# Patient Record
Sex: Female | Born: 1985 | Race: White | Hispanic: No | Marital: Single | Smoking: Current every day smoker
Health system: Southern US, Community
[De-identification: ages and names within clinical notes are randomized; demographics above are authoritative.]

## PROBLEM LIST (undated history)

## (undated) DIAGNOSIS — F32A Depression, unspecified: Secondary | ICD-10-CM

## (undated) DIAGNOSIS — F329 Major depressive disorder, single episode, unspecified: Secondary | ICD-10-CM

## (undated) DIAGNOSIS — B1920 Unspecified viral hepatitis C without hepatic coma: Secondary | ICD-10-CM

## (undated) DIAGNOSIS — F1910 Other psychoactive substance abuse, uncomplicated: Secondary | ICD-10-CM

## (undated) HISTORY — DX: Unspecified viral hepatitis C without hepatic coma: B19.20

---

## 2006-06-11 ENCOUNTER — Inpatient Hospital Stay: Payer: Self-pay | Admitting: Unknown Physician Specialty

## 2006-10-30 ENCOUNTER — Emergency Department: Payer: Self-pay | Admitting: Emergency Medicine

## 2007-02-11 ENCOUNTER — Emergency Department: Payer: Self-pay | Admitting: Emergency Medicine

## 2010-12-28 ENCOUNTER — Emergency Department: Payer: Self-pay | Admitting: Internal Medicine

## 2011-08-28 ENCOUNTER — Emergency Department: Payer: Self-pay | Admitting: Unknown Physician Specialty

## 2011-09-26 ENCOUNTER — Observation Stay: Payer: Self-pay

## 2011-10-08 ENCOUNTER — Inpatient Hospital Stay: Payer: Self-pay

## 2011-10-08 LAB — CBC WITH DIFFERENTIAL/PLATELET
Basophil #: 0 10*3/uL (ref 0.0–0.1)
Basophil %: 0.1 %
Eosinophil #: 0.1 10*3/uL (ref 0.0–0.7)
HCT: 34.1 % — ABNORMAL LOW (ref 35.0–47.0)
HGB: 11.4 g/dL — ABNORMAL LOW (ref 12.0–16.0)
Lymphocyte %: 24.6 %
MCH: 30.1 pg (ref 26.0–34.0)
MCHC: 33.6 g/dL (ref 32.0–36.0)
Monocyte #: 0.4 10*3/uL (ref 0.0–0.7)
Neutrophil %: 70 %
Platelet: 223 10*3/uL (ref 150–440)
RDW: 14 % (ref 11.5–14.5)
WBC: 9.6 10*3/uL (ref 3.6–11.0)

## 2011-10-10 LAB — HEMATOCRIT: HCT: 32.6 % — ABNORMAL LOW (ref 35.0–47.0)

## 2011-12-17 ENCOUNTER — Emergency Department: Payer: Self-pay | Admitting: Emergency Medicine

## 2011-12-17 LAB — COMPREHENSIVE METABOLIC PANEL
Alkaline Phosphatase: 68 U/L (ref 50–136)
BUN: 14 mg/dL (ref 7–18)
Bilirubin,Total: 0.3 mg/dL (ref 0.2–1.0)
Calcium, Total: 8.9 mg/dL (ref 8.5–10.1)
EGFR (Non-African Amer.): >60
Glucose: 85 mg/dL (ref 65–99)
SGOT(AST): 32 U/L (ref 15–37)
SGPT (ALT): 46 U/L
Total Protein: 8 g/dL (ref 6.4–8.2)

## 2011-12-17 LAB — TSH: Thyroid Stimulating Horm: 0.553 u[IU]/mL

## 2011-12-17 LAB — URINALYSIS, COMPLETE
Blood: NEGATIVE
Glucose,UR: NEGATIVE mg/dL (ref 0–75)
Nitrite: NEGATIVE
Specific Gravity: 1.021 (ref 1.003–1.030)
WBC UR: NONE SEEN /HPF (ref 0–5)

## 2011-12-17 LAB — DRUG SCREEN, URINE
Amphetamines, Ur Screen: NEGATIVE (ref ?–1000)
Barbiturates, Ur Screen: NEGATIVE (ref ?–200)
MDMA (Ecstasy)Ur Screen: NEGATIVE (ref ?–500)
Methadone, Ur Screen: NEGATIVE (ref ?–300)
Opiate, Ur Screen: POSITIVE (ref ?–300)
Phencyclidine (PCP) Ur S: NEGATIVE (ref ?–25)
Tricyclic, Ur Screen: NEGATIVE (ref ?–1000)

## 2011-12-17 LAB — CBC
MCH: 30.3 pg (ref 26.0–34.0)
Platelet: 309 10*3/uL (ref 150–440)
RBC: 4.71 10*6/uL (ref 3.80–5.20)
WBC: 8.7 10*3/uL (ref 3.6–11.0)

## 2011-12-17 LAB — ETHANOL
Ethanol %: <0.003 % (ref 0.000–0.080)
Ethanol: <3 mg/dL

## 2011-12-17 LAB — SALICYLATE LEVEL: Salicylates, Serum: 2.3 mg/dL

## 2012-08-13 ENCOUNTER — Emergency Department: Payer: Self-pay | Admitting: Emergency Medicine

## 2012-08-15 LAB — BETA STREP CULTURE(ARMC)

## 2012-11-15 ENCOUNTER — Emergency Department: Payer: Self-pay | Admitting: Emergency Medicine

## 2014-04-12 ENCOUNTER — Observation Stay: Payer: Self-pay | Admitting: Obstetrics and Gynecology

## 2014-04-12 LAB — URINALYSIS, COMPLETE
Bilirubin,UR: NEGATIVE
Blood: NEGATIVE
GLUCOSE, UR: NEGATIVE mg/dL (ref 0–75)
Nitrite: NEGATIVE
Ph: 6 (ref 4.5–8.0)
Protein: NEGATIVE
RBC,UR: 3 /HPF (ref 0–5)
Specific Gravity: 1.013 (ref 1.003–1.030)
WBC UR: 37 /HPF (ref 0–5)

## 2014-04-12 LAB — DRUG SCREEN, URINE
AMPHETAMINES, UR SCREEN: NEGATIVE (ref ?–1000)
Barbiturates, Ur Screen: NEGATIVE (ref ?–200)
Benzodiazepine, Ur Scrn: NEGATIVE (ref ?–200)
Cannabinoid 50 Ng, Ur ~~LOC~~: POSITIVE (ref ?–50)
Cocaine Metabolite,Ur ~~LOC~~: POSITIVE (ref ?–300)
MDMA (Ecstasy)Ur Screen: NEGATIVE (ref ?–500)
Methadone, Ur Screen: NEGATIVE (ref ?–300)
Opiate, Ur Screen: NEGATIVE (ref ?–300)
Phencyclidine (PCP) Ur S: NEGATIVE (ref ?–25)
TRICYCLIC, UR SCREEN: NEGATIVE (ref ?–1000)

## 2014-05-13 ENCOUNTER — Ambulatory Visit: Payer: Self-pay | Admitting: Obstetrics and Gynecology

## 2014-05-13 LAB — CBC WITH DIFFERENTIAL/PLATELET
BASOS ABS: 0 10*3/uL (ref 0.0–0.1)
BASOS PCT: 0.4 %
EOS PCT: 0.9 %
Eosinophil #: 0.1 10*3/uL (ref 0.0–0.7)
HCT: 37.7 % (ref 35.0–47.0)
HGB: 12 g/dL (ref 12.0–16.0)
Lymphocyte #: 1.9 10*3/uL (ref 1.0–3.6)
Lymphocyte %: 23.1 %
MCH: 28.8 pg (ref 26.0–34.0)
MCHC: 31.9 g/dL — ABNORMAL LOW (ref 32.0–36.0)
MCV: 90 fL (ref 80–100)
Monocyte #: 0.4 x10 3/mm (ref 0.2–0.9)
Monocyte %: 4.3 %
NEUTROS PCT: 71.3 %
Neutrophil #: 5.8 10*3/uL (ref 1.4–6.5)
PLATELETS: 183 10*3/uL (ref 150–440)
RBC: 4.17 10*6/uL (ref 3.80–5.20)
RDW: 14.4 % (ref 11.5–14.5)
WBC: 8.2 10*3/uL (ref 3.6–11.0)

## 2014-05-14 ENCOUNTER — Inpatient Hospital Stay: Payer: Self-pay

## 2014-05-14 LAB — DRUG SCREEN, URINE
Amphetamines, Ur Screen: NEGATIVE (ref ?–1000)
Barbiturates, Ur Screen: NEGATIVE (ref ?–200)
Benzodiazepine, Ur Scrn: NEGATIVE (ref ?–200)
Cannabinoid 50 Ng, Ur ~~LOC~~: POSITIVE (ref ?–50)
Cocaine Metabolite,Ur ~~LOC~~: NEGATIVE (ref ?–300)
MDMA (ECSTASY) UR SCREEN: NEGATIVE (ref ?–500)
Methadone, Ur Screen: NEGATIVE (ref ?–300)
OPIATE, UR SCREEN: NEGATIVE (ref ?–300)
Phencyclidine (PCP) Ur S: NEGATIVE (ref ?–25)
Tricyclic, Ur Screen: NEGATIVE (ref ?–1000)

## 2014-05-15 DIAGNOSIS — I495 Sick sinus syndrome: Secondary | ICD-10-CM

## 2014-05-15 LAB — COMPREHENSIVE METABOLIC PANEL
ALK PHOS: 133 U/L — AB
ALT: 11 U/L — AB
ANION GAP: 6 — AB (ref 7–16)
AST: 28 U/L (ref 15–37)
Albumin: 2.1 g/dL — ABNORMAL LOW (ref 3.4–5.0)
BILIRUBIN TOTAL: 0.3 mg/dL (ref 0.2–1.0)
BUN: 5 mg/dL — ABNORMAL LOW (ref 7–18)
CALCIUM: 7.6 mg/dL — AB (ref 8.5–10.1)
CREATININE: 0.78 mg/dL (ref 0.60–1.30)
Chloride: 102 mmol/L (ref 98–107)
Co2: 29 mmol/L (ref 21–32)
EGFR (African American): >60
EGFR (Non-African Amer.): >60
GLUCOSE: 64 mg/dL — AB (ref 65–99)
Osmolality: 269 (ref 275–301)
Potassium: 3.3 mmol/L — ABNORMAL LOW (ref 3.5–5.1)
Sodium: 137 mmol/L (ref 136–145)
Total Protein: 4.8 g/dL — ABNORMAL LOW (ref 6.4–8.2)

## 2014-05-15 LAB — HEMATOCRIT: HCT: 35.3 % (ref 35.0–47.0)

## 2014-05-16 LAB — PATHOLOGY REPORT

## 2014-10-23 ENCOUNTER — Emergency Department: Payer: Self-pay | Admitting: Emergency Medicine

## 2014-12-14 NOTE — Consult Note (Signed)
General Aspect Primary OB/GYN: Dr. Glennon Mac Primary Cardiologist: New to [facility] _____________________________  29 y/o female G2P1001 with no previously known cardiac history who presented to [facility] on 05/14/14 for C/S and was found to be bradycardic on routine post op EKG with electronic reading stating "possible anterior infarct." We are consulted for further evaluation.  ____________________________  PMH: 1. Substance abuse (Cocaine & THC) - UDS positive for THC only this admission 2. Asthma 3. H/o pyelonephritis 4. Tobacco abuse ____________________________  Surgical Hx: 1. Prior C/S   Present Illness 29 y/o female G2P1001 with no previously known cardiac history who presented to [facility] on 05/14/14 for C/S and was found to be bradycardic on routine post op EKG with electronic reading stating "possible anterior infarct."  We are consulted for further evaluation.  Patient presented to [facility] 9/22 for C/S and planned bilateral tubal ligation which she tolerated well. Upon routine post op check she had an EKG performed this morning which was indicated a sinus bradycardia of 47 BPM, without st/t changes. The electronic reading read "possible anterior infarct." She does have a history of polysubstance abuse including tobacco abuse, THC, and cocaine. UDS during this admission is positive for THC only.   She denies any SOB, DOE, presyncope, palpitations or h/o chest pain. She is sore from her operation and does have some right shoulder discomfort. She also notes some wheezing. She takes albuterol at home   Physical Exam:  GEN well developed, well nourished, no acute distress   HEENT PERRL   RESP normal resp effort  wheezing  bilateral wheezing, mild   CARD Regular rate and rhythm  No murmur   ABD denies tenderness  soft  normal BS   EXTR positive edema   SKIN normal to palpation   NEURO cranial nerves intact   PSYCH alert, A+O to time, place, person, good insight   Review of Systems:   Subjective/Chief Complaint ABD pain extending into right shoulder   General: No Complaints   Skin: No Complaints   ENT: No Complaints   Eyes: No Complaints   Neck: No Complaints   Respiratory: No Complaints   Cardiovascular: No Complaints   Gastrointestinal: abdominal   Genitourinary: No Complaints    Vascular: No Complaints   Musculoskeletal: No Complaints  right shoulder   Neurologic: No Complaints   Hematologic: No Complaints   Endocrine: No Complaints   Psychiatric: No Complaints   Review of Systems: All other systems were reviewed and found to be negative   Medications/Allergies Reviewed Medications/Allergies reviewed   Family & Social History:  Family and Social History:  Family History Negative  Zap   Social History positive Illicit drugs, UDS positive for THC. H/o cocaine use   + Tobacco Current (within 1 year)   Place of Living Home       Asthma:    Asthma:    Plantars Warts:    Herpes - genital:    denies:    Previous C/S:          Admit Reason:   Cesarean deliv (669.71): Onset Date: 13-May-2014, Status: Active, Coding System: ICD9, Coded Name: Cesarean delivery, without mention of indication, delivered, with or without mention of antepartum condition  Home Medications: Medication Instructions Status  albuterol CFC free 90 mcg/inh inhalation aerosol 2 puff(s) inhaled 4 times a day, As Needed - for Wheezing Active   Lab Results:  Hepatic:  23-Sep-15 04:34   Bilirubin, Total 0.3  Alkaline Phosphatase  133 (46-116 NOTE: New Reference  Range 03/12/14)  SGPT (ALT)  11 (14-63 NOTE: New Reference Range 03/12/14)  SGOT (AST) 28  Total Protein, Serum  4.8  Albumin, Serum  2.1  Routine Chem:  23-Sep-15 04:34   Glucose, Serum  64  BUN  5  Creatinine (comp) 0.78  Sodium, Serum 137  Potassium, Serum  3.3  Chloride, Serum 102  CO2, Serum 29  Calcium (Total), Serum  7.6  Osmolality (calc) 269  eGFR (African American) >60  eGFR  (Non-African American) >60 (eGFR values <22m/min/1.73 m2 may be an indication of chronic kidney disease (CKD). Calculated eGFR is useful in patients with stable renal function. The eGFR calculation will not be reliable in acutely ill patients when serum creatinine is changing rapidly. It is not useful in  patients on dialysis. The eGFR calculation may not be applicable to patients at the low and high extremes of body sizes, pregnant women, and vegetarians.)  Anion Gap  6  Urine Drugs:  [id number]-[id number]  Tricyclic Antidepressant, Ur Qual (comp) NEGATIVE (Result(s) reported on 14 May 2014 at 09:24AM.)  Amphetamines, Urine Qual. NEGATIVE  MDMA, Urine Qual. NEGATIVE  Cocaine Metabolite, Urine Qual. NEGATIVE  Opiate, Urine qual NEGATIVE  Phencyclidine, Urine Qual. NEGATIVE  Cannabinoid, Urine Qual. POSITIVE  Barbiturates, Urine Qual. NEGATIVE  Benzodiazepine, Urine Qual. NEGATIVE (----------------- The URINE DRUG SCREEN provides only a preliminary, unconfirmed analytical test result and should not be used for non-medical  purposes.  Clinical consideration and professional judgment should be  applied to any positive drug screen result due to possible interfering substances.  A more specific alternate chemical method must be used in order to obtain a confirmed analytical result.  Gas chromatography/mass spectrometry (GC/MS) is the preferred confirmatory method.)  Methadone, Urine Qual. NEGATIVE  Routine Hem:  23-Sep-15 04:34   Hematocrit (CBC) 35.3 (Result(s) reported on 15 May 2014 at 05:11AM.)   EKG:  EKG Interp. by me   Interpretation EKG shows sinus bradycardia, 47, no acute st/t changes, poor R wave progression through the anterior precordial leads    No Known Allergies:   Vital Signs/Nurse's Notes: **Vital Signs.:   23-Sep-15 04:30  Vital Signs Type Routine  Temperature Temperature (F) 97.8  Celsius 36.5  Temperature Source oral  Pulse Pulse 52  Respirations  Respirations 18  Systolic BP Systolic BP 1494 Diastolic BP (mmHg) Diastolic BP (mmHg) 82  Mean BP 99  Pulse Ox % Pulse Ox % 98  Pulse Ox Activity Level  At rest  Oxygen Delivery Room Air/ 21 %    Impression 29y/o female G2P1001 with no previously known cardiac history who [facility] and was found to be bradycardic on EKG with electronic reading stating "possible anterior infarct." We are consulted for further evaluation.  1. Sinus bradycardia: -Patient is asymptomatic. As an outpt with no significant near syncope or syncope.  -Vital signs indicate her HR was bradycardic prior to administration of any anesthesia with a HR of 47 upon admission, indicating it is not related to medication and was occuring prior to admission - with a stable BP. -Up and walking she is in the 60s Given this she does not require any further evaluation as an outpatient or treatment to incude Holter or PPM.  -Should she develop symptoms would recommend outpatient follow up for cardiac monitor. Our office info was provided. She will monitor her heart rate periodically using her phone pulse rate apps   2. EKG: essentially normal, finding read by computer  is likely from lead placement. -Given her lack of ischemic symptoms no further work up required.  -Recommend no THC, tobacco, or cocaine.  3. Wheezing: -Respiratory therapy -Albuterol has been added by Dr. Glennon Mac as she is not breast feeding incentive spirometer ordered  4. Right sided chest wall pain/abdominal pain: -2/2 surgery -Reproducible   Electronic Signatures: Rise Mu (PA-C)  (Signed 23-Sep-15 10:30)  Authored: General Aspect/Present Illness, History and Physical Exam, Review of System, Family & Social History, Past Medical History, Home Medications, Labs, EKG , Allergies, Vital Signs/Nurse's Notes, Impression/Plan Ida Rogue (MD)  (Signed 23-Sep-15 14:03)  Authored: General Aspect/Present Illness, History and  Physical Exam, Review of System, Family & Social History, Past Medical History, Health Issues, Labs, EKG , Impression/Plan  Co-Signer: General Aspect/Present Illness, History and Physical Exam, Review of System, Family & Social History, Past Medical History, Home Medications, Labs, EKG , Allergies, Vital Signs/Nurse's Notes, Impression/Plan   Last Updated: 23-Sep-15 14:03 by Ida Rogue (MD)

## 2014-12-14 NOTE — Op Note (Signed)
PATIENT NAME:  Kathleen Tate, Kathleen Tate MR#:  [id number] DATE OF BIRTH:  February 24, 1986  DATE OF PROCEDURE:  05/14/2014  PREOPERATIVE DIAGNOSES:  1.  Intrauterine pregnancy at 9039 weeks gestational age.  2.  History of prior cesarean section, desires repeat.  3.  Desires permanent sterility.  4.  History of drug abuse in pregnancy.   POSTOPERATIVE DIAGNOSES:   1.  Intrauterine pregnancy at 2339 weeks gestational age.  2.  History of prior cesarean section, desires repeat.  3.  Desires permanent sterility.  4.  History of drug abuse in pregnancy.   PROCEDURES:  1.  Repeat low transverse cesarean section via Pfannenstiel incision.  2.  Bilateral tubal ligation and via Pomeroy method.   ANESTHESIA: Spinal.   SURGEON: Thomasene MohairStephen Nasiir Monts, MD    ASSISTANT SURGEON: Annamarie MajorPaul Harris, MD    ESTIMATED BLOOD LOSS: 750 mL   OPERATIVE FLUIDS: 1200 mL crystalloid.   COMPLICATIONS: None.   FINDINGS:  1.  Viable female infant with Apgars of 9 at one minute and 9 at five minutes with weight of 2570 grams.  2.  Normal appearing gravid uterus, fallopian tubes and ovaries.   SPECIMENS: Portion of right and left fallopian tubes.   CONDITION AT END OF PROCEDURE: Stable.   PROCEDURE IN DETAIL: The patient was taken to the Operating Room where regional, spinal anesthesia was administered and found to be added adequate.  The patient was placed in the dorsal supine position with a leftward tilt and prepped and draped in the usual sterile fashion. After a timeout was called, a Pfannenstiel incision was made with a scalpel and carried through the various layers until the peritoneum was identified and entered sharply.  The peritoneal opening was extended and a bladder flap was created.  A bladder retractor was placed to pull the bladder out of the operative area of interest.  A low-transverse hysterotomy was made with the scalpel and extended laterally with cranial and caudal tension.  Rupture of membranes occurred with extension of  the hysterotomy for return of clear fluid.  The fetal vertex was grasped and elevated to the hysterotomy.  Vertex was delivered with fundal pressure, followed by the shoulders and the rest of the body without difficulty.  The cord was clamped and cut and the infant was handed to the pediatrician.  The placenta delivered spontaneously intact with 3 vessel cord.  Of note, there was a single nuchal cord, which was reduced at the time of delivery. The uterus was exteriorized and cleared of all clots and debris.  The hysterotomy was closed using #0 Vicryl in a running locked fashion.  Hemostasis was obtained with a single layer and so a single layer closure was performed given that she was having a tubal ligation.   Attention was turned to the left fallopian tube where the fallopian tube was identified and grasped with a Babcock clamp approximately 4 cm from the cornua.  Using the Pomeroy technique, a loop of approximately 3 cm in length was suture ligated using #0 plain gut and excised with good hemostasis noted.  The same procedure was carried out on the right side without difficulty.   The uterus was returned to the abdomen and the abdomen was cleared of all clots and debris.  The peritoneum was reapproximated using #0 Vicryl in a running fashion.   After verification of hemostasis on the rectus muscle bellies, the On-Q catheters were placed according to the manufacturer's recommendations.  They were inserted approximately 4 cm cephalad to the  incision line, approximately 1 cm apart, straddling the midline.  They were inserted to a depth of approximately the fourth mark on the catheters and positioned just superficial to rectus abdominis muscles and just deep to the rectus fascia.    The rectus fascia was closed using #1 PDS, looped. The skin was reapproximated using stapler.   The On-Q catheters were each bolused with 0.5% Marcaine plain, 5 mL in each catheter, for a total of 10 mL.  The catheters were  affixed to the skin using Dermabond as well as Steri-Strips and Tegaderm.   The patient tolerated the procedure well.  Sponge, lap, and needle counts were correct x 2.  The patient was given Ancef 2 grams prior to skin incision for antibiotic prophylaxis.  For VTE prophylaxis, the patient was wearing pneumatic compression stockings which were on and operating throughout the entire procedure.      ____________________________ Conard Novak, MD sdj:DT D: 05/14/2014 09:53:28 ET T: 05/14/2014 10:46:24 ET JOB#: [id number]  cc: Conard Novak, MD, <Dictator> Conard Novak MD ELECTRONICALLY SIGNED 05/15/2014 7:39

## 2014-12-15 NOTE — Op Note (Signed)
PATIENT NAME:  Kathleen Tate, Kathleen Tate MR#:  [id number] DATE OF BIRTH:  1986-01-19  DATE OF PROCEDURE:  10/09/2011  PREOPERATIVE DIAGNOSIS: Intrauterine pregnancy, [redacted] weeks gestation, fetal intolerance to labor.   POSTOPERATIVE DIAGNOSIS:  Intrauterine pregnancy, [redacted] weeks gestation, fetal intolerance to labor.    PROCEDURE PERFORMED: Low transverse cesarean section.   SURGEON: Deloris Pinghilip J. Luella Cookosenow, M.D.   FIRST ASSISTANT:  Tammy Gordy   NEONATOLOGIST:  Orpah Clintone. Horowitz  PREOP SITUATION: 29 year old gravida 1 has been in active labor and had several decelerations, most recently had a prolonged deceleration. Mutual decision was made to proceed with cesarean section.   OPERATIVE FINDINGS: 6-pound, 9-ounce female infant- Tristan-delivered at 09:09.   DESCRIPTION OF PROCEDURE: After adequate conduction anesthesia, the patient was prepped and draped in routine fashion. Skin incision in modified Pfannenstiel fashion was made and carried down the various layers and the peritoneal cavity was entered. Bladder flap was created and the bladder pushed down. A low transverse incision was made and the above-described infant was delivered without difficulty. The placenta was removed manually. The uterus was then closed with continuous lock suture of chromic 1. Rectus muscle was reapproximated in the midline. On-Q pumps were then instilled. The fascia was reapproximated with continuous sutures of Maxon. The skin was closed with skin staples. Estimated blood loss was 500 mL. The patient tolerated the procedure well and left the operating room in good condition.      Sponge and needle counts were said to be correct at the end of the procedure.   ____________________________ Deloris PingPhilip J. Luella Cookosenow, MD pjr:bjt D: 10/09/2011 09:47:25 ET T: 10/09/2011 12:01:13 ET JOB#: [id number]  cc: Deloris PingPhilip J. Luella Cookosenow, MD, <Dictator> Towana BadgerPHILIP J Oreoluwa Aigner MD ELECTRONICALLY SIGNED 10/11/2011 22:03

## 2014-12-31 NOTE — H&P (Signed)
L&D Evaluation:  History:  HPI 29 yo G2P1001 at 7328w3d by D=19 wk US derived EDC of 05/21/2014 presenting with back pain.  The pain is located over the lumbar spine, bilaterally, she states she has felt subjectively febrile but not recorded temps.  Has a history of pylonephritis in her G1 pregnancy and worried this might represent a kidney infrection.  Pain is constant does not come in waves.  Also has some groind pain R>L, positional, wrose when standing.  +FM, no LOF, no VB, no ctx.  [facility] at 18 weeks, THC and cocaine positive UDS, and prior C-ection for failure to progress 10/05/13.  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   Presents with back pain   Patient's Medical History No Chronic Illness    Patient's Surgical History Previous C-Section    Medications Pre Natal Vitamins   Allergies NKDA   Social History tobacco  drugs  Cannabis use as well as cocaine positive on 12/31/13   Family History Non-Contributory   Exam:  Vital Signs stable  T98.3; BP 116/72; HR 74   Urine Protein not completed   General no apparent distress   Mental Status clear   Chest clear   Heart normal sinus rhythm   Abdomen gravid, non-tender   Estimated Fetal Weight Average for gestational age   Fetal Position vtx   Back no CVAT, reproducible paraspinal muscle tenderness over the lumbar spine   Edema no edema   Pelvic 1/long/high   Mebranes Intact   FHT 150, moderate, positive accels, no decels   Ucx irritability   Impression:  Impression 29 yo G2P1001 at 3328w3d presenting with lumbago, round ligament, possible early UTI   Plan:  Comments 1) Back pain - no CVA tenderness, afebrile, +1 leuks and bacteria on UA.  Negative blood or nitrite.  Groin pain consistent with round ligament pain.  Discussed supportive measures such as tyelnol and heat.  If develops one sides midthoracic back pain, temperatue above 100.4 to represent - urine  for culture - Rx macrobid 100mg  tab po bid x 7 days  2) Fetus - category I tracing / reactive  3) PNL A pos / ABSC neg / RI / VZI / RPR NR / HIV neg / HBsAg neg / negative 2nd trimester screen / 1-hr 74  4) Substance abuse - positive for THC on 12/24/13 and 12/31/13 & 03/18/14 Cocaine & THC - UDS today agian positive for cocaine and THC - Discussed implications of cocaine use in pregnancy, assciation with elevated blood pressures/abruption  5) History of prior LTCS for failure to progress - desires repeat with BTL (paper signed 03/04/2014) scheduled for 05/14/14 SDJ  6) Disposition - discharge home, follow up in place 04/22/2014  5) TDAP - received 03/18/14   Electronic Signatures: Lorrene ReidStaebler, Lucia Harm M (MD)  (Signed 21-Aug-15 21:51)  Authored: L&D Evaluation   Last Updated: 21-Aug-15 21:51 by Lorrene ReidStaebler, Shaelyn Decarli M (MD)

## 2014-12-31 NOTE — H&P (Signed)
L&D Evaluation:  History:   HPI 29 year old G1 p0 with EDC=10/04/2011 by LMP=12/28/2010 presented to L&84D at 1738 6/7 weeks with c/o LOF starting between 8 and 9PM tonight. Has been having contractions for awhile, but they seeem to be stronger tonight. Denies vaginal bleeding. Baby active. Shortly after arrival while lying supine,  there was a FHR decel to 80 BPM x 70 seconds, which resolved with position change. 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 LABS: A POS, RI, VI, GBS negative    Presents with leaking fluid    Patient's Medical History Asthma    Patient's Surgical History none    Medications Pre Natal Vitamins  Motrin (Ibuprofen)  Zantac and Zofran prn    Allergies NKDA    Social History tobacco  drugs  MJ use early pregnancy    Family History Non-Contributory   ROS:   ROS see HPI   Exam:   Vital Signs stable    Urine Protein not completed    General no apparent distress    Mental Status clear    Chest clear    Heart normal sinus rhythm    Abdomen gravid, non-tender    Estimated Fetal Weight 5 1/2# to 6#    Fetal Position vtx    Edema trace edema    Reflexes 1+    Pelvic no external lesions, ZOX:WRUEAVWUSSE:negative pooling, Nitrazine and ferning. cx: 1/50%/-2 per RN exam    Mebranes Intact, AFI=10.3 cm,    FHT 135 baseline with accels and one decel to 80 BPM x 70 sec, moderate variability    Ucx irregular, frequent q1 min ctxs initially, now spacing out to 3-6 min apart.    Skin dry   Impression:   Impression IUP at 38 6/7 weeks with no evidence of SROM, but had a FHR decel   Plan:   Plan EFM/NST, Advised will monitor overnight and observe for any further FHR decels.   Electronic Signatures: Trinna BalloonGutierrez, Manley Fason L (CNM)  (Signed 04-Feb-13 01:40)  Authored: L&D  Evaluation   Last Updated: 04-Feb-13 01:40 by Trinna BalloonGutierrez, Annaleese Guier L (CNM)

## 2015-04-29 ENCOUNTER — Emergency Department
Admission: EM | Admit: 2015-04-29 | Discharge: 2015-04-29 | Disposition: A | Discharge status: Home / Self Care | Payer: Medicaid Other | Attending: Emergency Medicine | Admitting: Emergency Medicine

## 2015-04-29 ENCOUNTER — Encounter: Payer: Self-pay | Admitting: *Deleted

## 2015-04-29 DIAGNOSIS — M79641 Pain in right hand: Secondary | ICD-10-CM | POA: Diagnosis present

## 2015-04-29 DIAGNOSIS — M778 Other enthesopathies, not elsewhere classified: Secondary | ICD-10-CM | POA: Insufficient documentation

## 2015-04-29 DIAGNOSIS — Z720 Tobacco use: Secondary | ICD-10-CM | POA: Diagnosis not present

## 2015-04-29 MED ORDER — MELOXICAM 7.5 MG PO TABS: 7.5000 mg | ORAL_TABLET | Freq: Every day | ORAL | Status: DC

## 2015-04-29 NOTE — ED Notes (Signed)
Pt reports pain in right hand and numbness, right hand is swollen

## 2015-04-29 NOTE — ED Notes (Signed)
velcro wrist splint applied to right wrist. Patient tolerated well and instructed on correct use.

## 2015-04-30 NOTE — ED Provider Notes (Signed)
[facility] Emergency Department Provider Note  ____________________________________________  Time seen: Approximately 5:07 PM  I have reviewed the triage vital signs and the nursing notes.   HISTORY  Chief Complaint Hand Pain   HPI Kathleen Tate is a 29 y.o. female who presents to the emergency department for evaluation of right hand pain. She states that the pain has been present off and on for several months but has become worse over the past few days. She states that it at times feels numb and tingly and shoots sharp pains up her wrist. She states that when it feels numb and tingly she has a hard time making a fist. She denies specific injury.  History reviewed. No pertinent past medical history.  There are no active problems to display for this patient.   History reviewed. No pertinent past surgical history.  Current Outpatient Rx  Name  Route  Sig  Dispense  Refill  . meloxicam (MOBIC) 7.5 MG tablet   Oral   Take 1 tablet (7.5 mg total) by mouth daily.   30 tablet   0     Allergies Review of patient's allergies indicates no known allergies.  No family history on file.  Social History Social History  Substance Use Topics  . Smoking status: Current Every Day Smoker -- 0.50 packs/day    Types: Cigarettes  . Smokeless tobacco: None  . Alcohol Use: No    Review of Systems Constitutional: No recent illness. Eyes: No visual changes. ENT: No sore throat. Cardiovascular: Denies chest pain or palpitations. Respiratory: Denies shortness of breath. Gastrointestinal: No abdominal pain.  Genitourinary: Negative for dysuria. Musculoskeletal: Pain in right hand and wrist. Skin: Negative for rash. Neurological: Negative for headaches, focal weakness or numbness. 10-point ROS otherwise negative.  ____________________________________________   PHYSICAL EXAM:  VITAL SIGNS: ED Triage Vitals  Enc Vitals Group     BP 04/29/15 1657 104/61  mmHg     Pulse Rate 04/29/15 1657 86     Resp 04/29/15 1657 18     Temp 04/29/15 1657 98.1 F (36.7 C)     Temp Source 04/29/15 1657 Oral     SpO2 04/29/15 1657 100 %     Weight 04/29/15 1657 118 lb (53.524 kg)     Height 04/29/15 1657  (1.499 m)     Head Cir --      Peak Flow --      Pain Score 04/29/15 1658 8     Pain Loc --      Pain Edu? --      Excl. in GC? --     Constitutional: Alert and oriented. Well appearing and in no acute distress. Eyes: Conjunctivae are normal. EOMI. Head: Atraumatic. Nose: No congestion/rhinnorhea. Neck: No stridor.  Respiratory: Normal respiratory effort.   Musculoskeletal: Full range of motion of fingers and wrist. No deformity. Neurologic:  Normal speech and language. No gross focal neurologic deficits are appreciated. Speech is normal. No gait instability. Skin:  Skin is warm, dry and intact. Atraumatic. Psychiatric: Mood and affect are normal. Speech and behavior are normal.  ____________________________________________   LABS (all labs ordered are listed, but only abnormal results are displayed)  Labs Reviewed - No data to display ____________________________________________  RADIOLOGY  Not indicated ____________________________________________   PROCEDURES  Procedure(s) performed: Velcro wrist splint applied by ER tech. Neurovascularly intact post-splint application.   ____________________________________________   INITIAL IMPRESSION / ASSESSMENT AND PLAN / ED COURSE  Pertinent labs & imaging  results that were available during my care of the patient were reviewed by me and considered in my medical decision making (see chart for details).  Patient was advised to follow-up with orthopedics for symptoms that aren't not improving over the week. She was advised to return the emergency department for symptoms that change or worsen if she is unable schedule  appointment. ____________________________________________   FINAL CLINICAL IMPRESSION(S) / ED DIAGNOSES  Final diagnoses:  Tendonitis of wrist, right      Chinita Pester, FNP 04/30/15 0009  Phineas Semen, MD 04/30/15 2257

## 2015-09-23 ENCOUNTER — Emergency Department
Admission: EM | Admit: 2015-09-23 | Discharge: 2015-09-23 | Disposition: A | Discharge status: Home / Self Care | Payer: Medicaid Other | Attending: Emergency Medicine | Admitting: Emergency Medicine

## 2015-09-23 ENCOUNTER — Encounter: Payer: Self-pay | Admitting: Emergency Medicine

## 2015-09-23 DIAGNOSIS — F1410 Cocaine abuse, uncomplicated: Secondary | ICD-10-CM | POA: Diagnosis not present

## 2015-09-23 DIAGNOSIS — F1910 Other psychoactive substance abuse, uncomplicated: Secondary | ICD-10-CM

## 2015-09-23 DIAGNOSIS — F1610 Hallucinogen abuse, uncomplicated: Secondary | ICD-10-CM | POA: Diagnosis not present

## 2015-09-23 DIAGNOSIS — F17210 Nicotine dependence, cigarettes, uncomplicated: Secondary | ICD-10-CM | POA: Diagnosis not present

## 2015-09-23 DIAGNOSIS — Z791 Long term (current) use of non-steroidal anti-inflammatories (NSAID): Secondary | ICD-10-CM | POA: Diagnosis not present

## 2015-09-23 HISTORY — DX: Depression, unspecified: F32.A

## 2015-09-23 HISTORY — DX: Major depressive disorder, single episode, unspecified: F32.9

## 2015-09-23 LAB — ETHANOL: Alcohol, Ethyl (B): <5 mg/dL (ref <5)

## 2015-09-23 LAB — GLUCOSE, CAPILLARY: Glucose-Capillary: 91 mg/dL (ref 65–99)

## 2015-09-23 NOTE — ED Notes (Signed)
Meal has been provided  - pt observed lying in bed NAD assessed  Continue to monitor

## 2015-09-23 NOTE — ED Notes (Signed)
BEHAVIORAL HEALTH ROUNDING Patient sleeping: Yes.   Patient alert and oriented: eyes closed  Appears asleep Behavior appropriate: Yes.  ; If no, describe:  Nutrition and fluids offered: Yes  Toileting and hygiene offered: sleeping Sitter present: q 15 minute observations and security monitoring Law enforcement present: yes  ODS 

## 2015-09-23 NOTE — [facility] Counselor (Signed)
Pt requesting inpatient SA program.  Pt states she has been to [facility] before and is requesting to go back there.  TTS counselor faxed referral information to [facility].  Pt states she would like to be discharged from ED and states she will go stay with her brother.

## 2015-09-23 NOTE — ED Notes (Signed)
Pt to ed with c/o wanting detox from drugs.  Pt states "I use everything I can"  Pt states last use of drugs was yesterday, used marijuana, suboxen, morphine and alcohol. Pt states 2 days ago used cocaine, and "molly"

## 2015-09-23 NOTE — ED Notes (Signed)
Supper provided along with an extra drink  Pt observed with no unusual behavior  Appropriate to stimulation  No verbalized needs or concerns at this time  NAD assessed  Continue to monitor 

## 2015-09-23 NOTE — Discharge Instructions (Signed)
You have been seen in the Emergency Department (ED) today for a psychiatric complaint.  You have been evaluated by psychiatry and we believe you are safe to be discharged from the hospital.   ° °Please return to the ED immediately if you have ANY thoughts of hurting yourself or anyone else, so that we may help you. ° °Please avoid alcohol and drug use. ° °Follow up with your doctor and/or therapist as soon as possible regarding today's ED visit.   Please follow up any other recommendations and clinic appointments provided by the psychiatry team that saw you in the Emergency Department. ° ° °Polysubstance Abuse °When people abuse more than one drug or type of drug it is called polysubstance or polydrug abuse. For example, many smokers also drink alcohol. This is one form of polydrug abuse. Polydrug abuse also refers to the use of a drug to counteract an unpleasant effect produced by another drug. It may also be used to help with withdrawal from another drug. People who take stimulants may become agitated. Sometimes this agitation is countered with a tranquilizer. This helps protect against the unpleasant side effects. Polydrug abuse also refers to the use of different drugs at the same time.  °Anytime drug use is interfering with normal living activities, it has become abuse. This includes problems with family and friends. Psychological dependence has developed when your mind tells you that the drug is needed. This is usually followed by physical dependence which has developed when continuing increases of drug are required to get the same feeling or "high". This is known as addiction or chemical dependency. A person's risk is much higher if there is a history of chemical dependency in the family. °SIGNS OF CHEMICAL DEPENDENCY °· You have been told by friends or family that drugs have become a problem. °· You fight when using drugs. °· You are having blackouts (not remembering what you do while using). °· You feel  sick from using drugs but continue using. °· You lie about use or amounts of drugs (chemicals) used. °· You need chemicals to get you going. °· You are suffering in work performance or in school because of drug use. °· You get sick from use of drugs but continue to use anyway. °· You need drugs to relate to people or feel comfortable in social situations. °· You use drugs to forget problems. °"Yes" answered to any of the above signs of chemical dependency indicates there are problems. The longer the use of drugs continues, the greater the problems will become. °If there is a family history of drug or alcohol use, it is best not to experiment with these drugs. Continual use leads to tolerance. After tolerance develops more of the drug is needed to get the same feeling. This is followed by addiction. With addiction, drugs become the most important part of life. It becomes more important to take drugs than participate in the other usual activities of life. This includes relating to friends and family. Addiction is followed by dependency. Dependency is a condition where drugs are now needed not just to get high, but to feel normal. °Addiction cannot be cured but it can be stopped. This often requires outside help and the care of professionals. Treatment centers are listed in the yellow pages under: Cocaine, Narcotics, and Alcoholics Anonymous. Most hospitals and clinics can refer you to a specialized care center. Talk to your caregiver if you need help. °  °This information is not intended to replace advice given   to you by your health care provider. Make sure you discuss any questions you have with your health care provider.   Document Released: 03/31/2005 Document Revised: 11/01/2011 Document Reviewed: 08/14/2014 Elsevier Interactive Patient Education [employer]

## 2015-09-23 NOTE — ED Notes (Signed)
BEHAVIORAL HEALTH ROUNDING Patient sleeping: No. Patient alert and oriented: yes Behavior appropriate: Yes.  ; If no, describe:  Nutrition and fluids offered: yes Toileting and hygiene offered: Yes  Sitter present: q15 minute observations and security  ENVIRONMENTAL ASSESSMENT Potentially harmful objects out of patient reach: Yes.   Personal belongings secured: Yes.   Patient dressed in hospital provided attire only: Yes.   Plastic bags out of patient reach: Yes.   Patient care equipment (cords, cables, call bells, lines, and drains) shortened, removed, or accounted for: Yes.   Equipment and supplies removed from bottom of stretcher: Yes.   Potentially toxic materials out of patient reach: Yes.   Sharps container removed or out of patient reach: Yes.   monitoring Law enforcement present: Yes  ODS  

## 2015-09-23 NOTE — ED Provider Notes (Addendum)
[facility] Emergency Department Provider Note  ____________________________________________  Time seen: Approximately 4:20 PM  I have reviewed the triage vital signs and the nursing notes.   HISTORY  Chief Complaint Drug Problem    HPI Kathleen Tate is a 30 y.o. female the previous history of drug abuse and depression.  The patient tells me that she was kicked out of her in-laws home today. She is been abusing drugs, and she reports she last used ecstasy and cocaine about 2 days ago. She doesn't a history of alcohol abuse but has not had a drink in several days. She denies shakes tremors or withdrawals.  The patient comes requesting detox evaluation. She denies any other concerns but does feel tired as though she is "washing out".  She denies any thoughts of wishing to harm herself or anyone else. Denies hallucinations. Denies pregnancy.  Past Medical History  Diagnosis Date  . Depression     There are no active problems to display for this patient.   History reviewed. No pertinent past surgical history.  Current Outpatient Rx  Name  Route  Sig  Dispense  Refill  . meloxicam (MOBIC) 7.5 MG tablet   Oral   Take 1 tablet (7.5 mg total) by mouth daily.   30 tablet   0     Allergies Review of patient's allergies indicates no known allergies.  History reviewed. No pertinent family history.  Social History Social History  Substance Use Topics  . Smoking status: Current Every Day Smoker -- 0.50 packs/day    Types: Cigarettes  . Smokeless tobacco: None  . Alcohol Use: Yes    Review of Systems Constitutional: No fever/chills Eyes: No visual changes. ENT: No sore throat. Cardiovascular: Denies chest pain. Respiratory: Denies shortness of breath. Gastrointestinal: No abdominal pain.  No nausea, no vomiting.  No diarrhea.  No constipation. Genitourinary: Negative for dysuria. Musculoskeletal: Negative for back pain. Skin: Negative for  rash. Neurological: Negative for headaches, focal weakness or numbness.  10-point ROS otherwise negative.  ____________________________________________   PHYSICAL EXAM:  VITAL SIGNS: ED Triage Vitals  Enc Vitals Group     BP 09/23/15 1425 122/78 mmHg     Pulse Rate 09/23/15 1425 99     Resp 09/23/15 1425 20     Temp --      Temp Source 09/23/15 1425 Oral     SpO2 09/23/15 1425 99 %     Weight 09/23/15 1425 118 lb (53.524 kg)     Height --      Head Cir --      Peak Flow --      Pain Score 09/23/15 1426 0     Pain Loc --      Pain Edu? --      Excl. in GC? --    Constitutional: Resting comfortably in a hallway, alerts to voice and is then alert and oriented. Well appearing and in no acute distress. Eyes: Conjunctivae are normal. PERRL. EOMI. Head: Atraumatic. Nose: No congestion/rhinnorhea. Mouth/Throat: Mucous membranes are moist.  Oropharynx non-erythematous. Neck: No stridor.   Cardiovascular: Normal rate, regular rhythm. Grossly normal heart sounds.  Good peripheral circulation. Respiratory: Normal respiratory effort.  No retractions. Lungs CTAB. Gastrointestinal: Soft and nontender. No distention. No abdominal bruits. No CVA tenderness. Musculoskeletal: No lower extremity tenderness nor edema.  No joint effusions. Neurologic:  Normal speech and language. No gross focal neurologic deficits are appreciated. Skin:  Skin is warm, dry and intact. No rash noted.  Psychiatric: Mood and affect are normal. Speech and behavior are normal.  ____________________________________________   LABS (all labs ordered are listed, but only abnormal results are displayed)  Labs Reviewed  ETHANOL  GLUCOSE, CAPILLARY  CBG MONITORING, ED   ____________________________________________  EKG   ____________________________________________  RADIOLOGY   ____________________________________________   PROCEDURES  Procedure(s) performed: None  Critical Care performed:  No  ____________________________________________   INITIAL IMPRESSION / ASSESSMENT AND PLAN / ED COURSE  Pertinent labs & imaging results that were available during my care of the patient were reviewed by me and considered in my medical decision making (see chart for details).  Patient presents for evaluation of substance abuse. Denies acute psychiatric concerns. History of polysubstance abuse. Have ordered consultation by TTS service to further evaluate and make recommendations for detox care. Presently does not have any indications to suggest the need for psychiatric placement. She is tired, but alerts and conversant. Well oriented in no distress. Denies any overdose or suicidal ideation. No evidence of acute withdrawal.  ----------------------------------------- 4:24 PM on 09/23/2015 ----------------------------------------- Patient awake and alert. No distress. Labs reviewed. Plan to transfer to [facility] pending detox evaluation by TTS team. Anticipate disposition based on TTS recommendations.  ____________________________________________   FINAL CLINICAL IMPRESSION(S) / ED DIAGNOSES  Final diagnoses:  Polysubstance abuse      Sharyn Creamer, MD 09/23/15 1908  ----------------------------------------- 8:12 PM on 09/23/2015 -----------------------------------------  Patient awake alert ambulatory. She is having her brother picked her up and will be going to [facility].  Return precautions and treatment recommendations and follow-up discussed with the patient who is agreeable with the plan.   Sharyn Creamer, MD 09/23/15 2012

## 2015-09-23 NOTE — ED Notes (Signed)
BEHAVIORAL HEALTH ROUNDING Patient sleeping: No. Patient alert and oriented: yes Behavior appropriate: Yes.  ; If no, describe:  Nutrition and fluids offered: yes Toileting and hygiene offered: Yes  Sitter present: q15 minute observations and security  monitoring Law enforcement present: Yes  ODS  

## 2016-03-19 ENCOUNTER — Encounter: Payer: Self-pay | Admitting: Emergency Medicine

## 2016-03-19 ENCOUNTER — Emergency Department: Payer: Medicaid Other

## 2016-03-19 ENCOUNTER — Inpatient Hospital Stay
Admission: EM | Admit: 2016-03-19 | Discharge: 2016-03-20 | DRG: 442 | Discharge status: Against Medical Advice | Payer: Medicaid Other | Attending: Internal Medicine | Admitting: Internal Medicine

## 2016-03-19 DIAGNOSIS — D72819 Decreased white blood cell count, unspecified: Secondary | ICD-10-CM

## 2016-03-19 DIAGNOSIS — I959 Hypotension, unspecified: Secondary | ICD-10-CM | POA: Diagnosis present

## 2016-03-19 DIAGNOSIS — N289 Disorder of kidney and ureter, unspecified: Secondary | ICD-10-CM | POA: Diagnosis present

## 2016-03-19 DIAGNOSIS — L03113 Cellulitis of right upper limb: Secondary | ICD-10-CM | POA: Diagnosis present

## 2016-03-19 DIAGNOSIS — F329 Major depressive disorder, single episode, unspecified: Secondary | ICD-10-CM | POA: Diagnosis present

## 2016-03-19 DIAGNOSIS — K759 Inflammatory liver disease, unspecified: Secondary | ICD-10-CM

## 2016-03-19 DIAGNOSIS — F1490 Cocaine use, unspecified, uncomplicated: Secondary | ICD-10-CM | POA: Diagnosis present

## 2016-03-19 DIAGNOSIS — B179 Acute viral hepatitis, unspecified: Secondary | ICD-10-CM | POA: Diagnosis not present

## 2016-03-19 DIAGNOSIS — F1010 Alcohol abuse, uncomplicated: Secondary | ICD-10-CM | POA: Diagnosis present

## 2016-03-19 DIAGNOSIS — D696 Thrombocytopenia, unspecified: Secondary | ICD-10-CM | POA: Diagnosis present

## 2016-03-19 DIAGNOSIS — R778 Other specified abnormalities of plasma proteins: Secondary | ICD-10-CM | POA: Diagnosis present

## 2016-03-19 DIAGNOSIS — F17210 Nicotine dependence, cigarettes, uncomplicated: Secondary | ICD-10-CM | POA: Diagnosis present

## 2016-03-19 DIAGNOSIS — E876 Hypokalemia: Secondary | ICD-10-CM | POA: Diagnosis present

## 2016-03-19 DIAGNOSIS — E860 Dehydration: Secondary | ICD-10-CM | POA: Diagnosis present

## 2016-03-19 DIAGNOSIS — L03114 Cellulitis of left upper limb: Secondary | ICD-10-CM | POA: Diagnosis present

## 2016-03-19 DIAGNOSIS — R740 Nonspecific elevation of levels of transaminase and lactic acid dehydrogenase [LDH]: Secondary | ICD-10-CM | POA: Diagnosis present

## 2016-03-19 DIAGNOSIS — R000 Tachycardia, unspecified: Secondary | ICD-10-CM | POA: Diagnosis present

## 2016-03-19 DIAGNOSIS — F1590 Other stimulant use, unspecified, uncomplicated: Secondary | ICD-10-CM | POA: Diagnosis present

## 2016-03-19 DIAGNOSIS — R945 Abnormal results of liver function studies: Secondary | ICD-10-CM

## 2016-03-19 DIAGNOSIS — F1290 Cannabis use, unspecified, uncomplicated: Secondary | ICD-10-CM | POA: Diagnosis present

## 2016-03-19 DIAGNOSIS — R1011 Right upper quadrant pain: Secondary | ICD-10-CM

## 2016-03-19 DIAGNOSIS — R7989 Other specified abnormal findings of blood chemistry: Secondary | ICD-10-CM

## 2016-03-19 DIAGNOSIS — Z590 Homelessness: Secondary | ICD-10-CM | POA: Diagnosis not present

## 2016-03-19 DIAGNOSIS — E871 Hypo-osmolality and hyponatremia: Secondary | ICD-10-CM | POA: Diagnosis present

## 2016-03-19 HISTORY — DX: Other psychoactive substance abuse, uncomplicated: F19.10

## 2016-03-19 LAB — CBC WITH DIFFERENTIAL/PLATELET
BASOS ABS: 0 10*3/uL (ref 0–0.1)
BASOS PCT: 0 %
EOS ABS: 0 10*3/uL (ref 0–0.7)
EOS PCT: 0 %
HCT: 45 % (ref 35.0–47.0)
Hemoglobin: 15.6 g/dL (ref 12.0–16.0)
Lymphocytes Relative: 4 %
Lymphs Abs: 0.2 10*3/uL — ABNORMAL LOW (ref 1.0–3.6)
MCH: 31.3 pg (ref 26.0–34.0)
MCHC: 34.7 g/dL (ref 32.0–36.0)
MCV: 90.3 fL (ref 80.0–100.0)
MONO ABS: 0.1 10*3/uL — AB (ref 0.2–0.9)
MONOS PCT: 3 %
NEUTROS ABS: 4.1 10*3/uL (ref 1.4–6.5)
Neutrophils Relative %: 93 %
PLATELETS: 139 10*3/uL — AB (ref 150–440)
RBC: 4.98 MIL/uL (ref 3.80–5.20)
RDW: 13.3 % (ref 11.5–14.5)
WBC: 4.5 10*3/uL (ref 3.6–11.0)

## 2016-03-19 LAB — ACETAMINOPHEN LEVEL

## 2016-03-19 LAB — COMPREHENSIVE METABOLIC PANEL
ALT: 405 U/L — AB (ref 14–54)
AST: 739 U/L — ABNORMAL HIGH (ref 15–41)
Albumin: 4.2 g/dL (ref 3.5–5.0)
Alkaline Phosphatase: 114 U/L (ref 38–126)
Anion gap: 9 (ref 5–15)
BILIRUBIN TOTAL: 3.7 mg/dL — AB (ref 0.3–1.2)
BUN: 17 mg/dL (ref 6–20)
CALCIUM: 8.7 mg/dL — AB (ref 8.9–10.3)
CHLORIDE: 102 mmol/L (ref 101–111)
CO2: 25 mmol/L (ref 22–32)
CREATININE: 1.03 mg/dL — AB (ref 0.44–1.00)
Glucose, Bld: 112 mg/dL — ABNORMAL HIGH (ref 65–99)
Potassium: 3 mmol/L — ABNORMAL LOW (ref 3.5–5.1)
Sodium: 136 mmol/L (ref 135–145)
TOTAL PROTEIN: 7.7 g/dL (ref 6.5–8.1)

## 2016-03-19 LAB — URINALYSIS COMPLETE WITH MICROSCOPIC (ARMC ONLY)
GLUCOSE, UA: NEGATIVE mg/dL
HGB URINE DIPSTICK: NEGATIVE
Ketones, ur: NEGATIVE mg/dL
LEUKOCYTES UA: NEGATIVE
NITRITE: NEGATIVE
Protein, ur: >500 mg/dL — AB
SPECIFIC GRAVITY, URINE: 1.031 — AB (ref 1.005–1.030)
pH: 6 (ref 5.0–8.0)

## 2016-03-19 LAB — URINE DRUG SCREEN, QUALITATIVE (ARMC ONLY)
AMPHETAMINES, UR SCREEN: POSITIVE — AB
BENZODIAZEPINE, UR SCRN: POSITIVE — AB
Barbiturates, Ur Screen: NOT DETECTED
Cannabinoid 50 Ng, Ur ~~LOC~~: POSITIVE — AB
Cocaine Metabolite,Ur ~~LOC~~: POSITIVE — AB
MDMA (ECSTASY) UR SCREEN: NOT DETECTED
METHADONE SCREEN, URINE: NOT DETECTED
Opiate, Ur Screen: NOT DETECTED
PHENCYCLIDINE (PCP) UR S: NOT DETECTED
TRICYCLIC, UR SCREEN: NOT DETECTED

## 2016-03-19 LAB — PREGNANCY, URINE: PREG TEST UR: NEGATIVE

## 2016-03-19 LAB — SALICYLATE LEVEL

## 2016-03-19 LAB — ETHANOL

## 2016-03-19 LAB — LIPASE, BLOOD: LIPASE: 19 U/L (ref 11–51)

## 2016-03-19 LAB — LACTIC ACID, PLASMA
LACTIC ACID, VENOUS: 1 mmol/L (ref 0.5–1.9)
LACTIC ACID, VENOUS: 1.1 mmol/L (ref 0.5–1.9)

## 2016-03-19 MED ORDER — IBUPROFEN 400 MG PO TABS
400.0000 mg | ORAL_TABLET | Freq: Three times a day (TID) | ORAL | Status: DC | PRN
  Administered 2016-03-19 – 2016-03-20 (×2): 400 mg via ORAL
  Filled 2016-03-19 (×2): qty 1

## 2016-03-19 MED ORDER — POTASSIUM CHLORIDE IN NACL 20-0.9 MEQ/L-% IV SOLN
INTRAVENOUS | Status: DC
  Administered 2016-03-19: 18:00:00 via INTRAVENOUS
  Filled 2016-03-19 (×3): qty 1000

## 2016-03-19 MED ORDER — ONDANSETRON HCL 4 MG/2ML IJ SOLN: 4.0000 mg | Freq: Four times a day (QID) | INTRAMUSCULAR | Status: DC | PRN

## 2016-03-19 MED ORDER — PIPERACILLIN-TAZOBACTAM 3.375 G IVPB 30 MIN
3.3750 g | Freq: Once | INTRAVENOUS | Status: AC
  Administered 2016-03-19: 3.375 g via INTRAVENOUS
  Filled 2016-03-19: qty 50

## 2016-03-19 MED ORDER — VANCOMYCIN HCL IN DEXTROSE 1-5 GM/200ML-% IV SOLN
1000.0000 mg | INTRAVENOUS | Status: DC
  Administered 2016-03-20: 1000 mg via INTRAVENOUS
  Filled 2016-03-19 (×2): qty 200

## 2016-03-19 MED ORDER — DOCUSATE SODIUM 100 MG PO CAPS
100.0000 mg | ORAL_CAPSULE | Freq: Two times a day (BID) | ORAL | Status: DC
  Administered 2016-03-20: 100 mg via ORAL
  Filled 2016-03-19 (×2): qty 1

## 2016-03-19 MED ORDER — CITALOPRAM HYDROBROMIDE 20 MG PO TABS
20.0000 mg | ORAL_TABLET | Freq: Every day | ORAL | Status: DC
  Administered 2016-03-19 – 2016-03-20 (×2): 20 mg via ORAL
  Filled 2016-03-19 (×2): qty 1

## 2016-03-19 MED ORDER — SODIUM CHLORIDE 0.9 % IV BOLUS (SEPSIS)
30.0000 mL/kg | Freq: Once | INTRAVENOUS | Status: AC
  Administered 2016-03-19: 1632 mL via INTRAVENOUS

## 2016-03-19 MED ORDER — VANCOMYCIN HCL IN DEXTROSE 1-5 GM/200ML-% IV SOLN
1000.0000 mg | Freq: Once | INTRAVENOUS | Status: AC
Start: 2016-03-19 — End: 2016-03-19
  Administered 2016-03-19: 1000 mg via INTRAVENOUS
  Filled 2016-03-19: qty 200

## 2016-03-19 MED ORDER — ENOXAPARIN SODIUM 40 MG/0.4ML ~~LOC~~ SOLN
40.0000 mg | SUBCUTANEOUS | Status: DC
  Administered 2016-03-19: 40 mg via SUBCUTANEOUS
  Filled 2016-03-19: qty 0.4

## 2016-03-19 MED ORDER — ONDANSETRON HCL 4 MG PO TABS
4.0000 mg | ORAL_TABLET | Freq: Four times a day (QID) | ORAL | Status: DC | PRN
  Administered 2016-03-20 (×2): 4 mg via ORAL
  Filled 2016-03-19 (×2): qty 1

## 2016-03-19 MED ORDER — PIPERACILLIN-TAZOBACTAM 3.375 G IVPB 30 MIN
3.3750 g | Freq: Three times a day (TID) | INTRAVENOUS | Status: DC
  Administered 2016-03-19 – 2016-03-20 (×3): 3.375 g via INTRAVENOUS
  Filled 2016-03-19 (×6): qty 50

## 2016-03-19 NOTE — ED Provider Notes (Signed)
[facility]-EMERGENCY DEPARTMENT Provider Note   CSN: [id number] Arrival date & time: 03/19/16  1158  First Provider Contact:  First MD Initiated Contact with Patient 03/19/16 1440        History   Chief Complaint Chief Complaint  Patient presents with  . Fever    HPI Kathleen Tate is a 30 y.o. female hx of IV drug use here presenting with abdominal pain, fever. Patient states that she uses IV drugs. She used to snort cocaine but now shoots up cocaine. She also does amphetamines as well as marijuana and drinks alcohol. She states that her boyfriend was in jail for the last couple months and she is more depressed so she started using drugs. She is also homeless and lives with friends currently. Denies any suicidal attempt. Patient states that she's been having subjective chills since yesterday and has had diffuse myalgias. Also feeling nauseated and has worsening right flank pain and right upper quadrant pain. Denies any vomiting but does feel nauseated. Code sepsis activated at triage.   The history is provided by the patient.    Past Medical History:  Diagnosis Date  . Depression   . Drug abuse     There are no active problems to display for this patient.   History reviewed. No pertinent surgical history.  OB History    No data available       Home Medications    Prior to Admission medications   Medication Sig Start Date End Date Taking? Authorizing Provider  meloxicam (MOBIC) 7.5 MG tablet Take 1 tablet (7.5 mg total) by mouth daily. 04/29/15 04/28/16  Chinita Pester, FNP    Family History History reviewed. No pertinent family history.  Social History Social History  Substance Use Topics  . Smoking status: Current Every Day Smoker    Packs/day: 0.50    Types: Cigarettes  . Smokeless tobacco: Not on file  . Alcohol use Yes     Allergies   Cyclobenzaprine   Review of Systems Review of Systems  Constitutional: Positive for fever.  Skin: Positive for rash.    All other systems reviewed and are negative.    Physical Exam Updated Vital Signs BP 101/84 (BP Location: Right Arm)   Pulse 88   Temp (!) 100.4 F (38 C) (Oral)   Resp 16   Ht  (1.499 m)   Wt 120 lb (54.4 kg)   LMP 03/04/2016 (Approximate) Comment: Tubal Ligation  SpO2 100%   BMI 24.24 kg/m   Physical Exam  Constitutional: She is oriented to person, place, and time.  Uncomfortable   HENT:  Head: Normocephalic.  Mouth/Throat: Oropharynx is clear and moist.  Eyes: Conjunctivae and EOM are normal. Pupils are equal, round, and reactive to light.  Neck: Normal range of motion. Neck supple.  Cardiovascular: Normal rate, regular rhythm and normal heart sounds.   Pulmonary/Chest: Effort normal and breath sounds normal.  Abdominal: Soft.  + mild RUQ and R CVAT.   Musculoskeletal: Normal range of motion.  Neurological: She is alert and oriented to person, place, and time. She displays normal reflexes. No cranial nerve deficit. Coordination normal.  Skin: Skin is warm.  Cellulitis bilateral forearm, no obvious abscess   Psychiatric: She has a normal mood and affect.  Nursing note and vitals reviewed.    ED Treatments / Results  Labs (all labs ordered are listed, but only abnormal results are displayed) Labs Reviewed  COMPREHENSIVE METABOLIC PANEL - Abnormal; Notable for the following:  Result Value   Potassium 3.0 (*)    Glucose, Bld 112 (*)    Creatinine, Ser 1.03 (*)    Calcium 8.7 (*)    AST 739 (*)    ALT 405 (*)    Total Bilirubin 3.7 (*)    All other components within normal limits  CBC WITH DIFFERENTIAL/PLATELET - Abnormal; Notable for the following:    Platelets 139 (*)    Lymphs Abs 0.2 (*)    Monocytes Absolute 0.1 (*)    All other components within normal limits  URINALYSIS COMPLETEWITH MICROSCOPIC ([facility] ONLY) - Abnormal; Notable for the following:    Color, Urine AMBER (*)    APPearance CLEAR (*)    Bilirubin Urine 2+ (*)    Specific  Gravity, Urine 1.031 (*)    Protein, ur >500 (*)    Bacteria, UA RARE (*)    Squamous Epithelial / LPF 6-30 (*)    All other components within normal limits  ACETAMINOPHEN LEVEL - Abnormal; Notable for the following:    Acetaminophen (Tylenol), Serum <10 (*)    All other components within normal limits  URINE DRUG SCREEN, QUALITATIVE ([facility] ONLY) - Abnormal; Notable for the following:    Amphetamines, Ur Screen POSITIVE (*)    Cocaine Metabolite,Ur [place] POSITIVE (*)    Cannabinoid 50 Ng, Ur [place] POSITIVE (*)    Benzodiazepine, Ur Scrn POSITIVE (*)    All other components within normal limits  CULTURE, BLOOD (ROUTINE X 2)  CULTURE, BLOOD (ROUTINE X 2)  URINE CULTURE  LACTIC ACID, PLASMA  LIPASE, BLOOD  PREGNANCY, URINE  ETHANOL  SALICYLATE LEVEL  LACTIC ACID, PLASMA  HEPATITIS PANEL, ACUTE    EKG  EKG Interpretation None       ED ECG REPORT I, YAO, DAVID, the attending physician, personally viewed and interpreted this ECG.   Date: 03/19/2016  EKG Time: 12:27 pm  Rate: 125  Rhythm: sinus tachycardia  Axis: normal  Intervals:none  ST&T Change: nonspecific   ED ECG REPORT I, YAO, DAVID, the attending physician, personally viewed and interpreted this ECG.   Date: 03/19/2016  EKG Time: 14:54 pm  Rate: 75  Rhythm: normal EKG, normal sinus rhythm  Axis: normal  Intervals:none  ST&T Change: TWI inferior and lateral     Radiology Dg Chest 2 View  Result Date: 03/19/2016 CLINICAL DATA:  Nausea, vomiting.  Weakness. EXAM: CHEST  2 VIEW COMPARISON:  None. FINDINGS: The heart size and mediastinal contours are within normal limits. Both lungs are clear. No pneumothorax or pleural effusion is noted. Mild dextroscoliosis of thoracic spine is noted. IMPRESSION: No active cardiopulmonary disease. Electronically Signed   By: Lupita Raider, M.D.   On: 03/19/2016 13:42  US Abdomen Complete  Result Date: 03/19/2016 CLINICAL DATA:  Elevated liver function tests. Acute right upper  quadrant abdominal pain. EXAM: ABDOMEN ULTRASOUND COMPLETE COMPARISON:  None. FINDINGS: Gallbladder: No gallstones are noted. Minimal gallbladder wall thickening at 3 mm is noted without pericholecystic fluid. No sonographic Murphy's sign is noted. Common bile duct: Diameter: 3 mm which is within normal limits. Liver: 7 mm hyperechoic focus is noted in right hepatic lobe. Within normal limits in parenchymal echogenicity. IVC: No abnormality visualized. Pancreas: Visualized portion unremarkable. Spleen: Size and appearance within normal limits. Right Kidney: Length: 10.5 cm. Echogenicity within normal limits. No mass or hydronephrosis visualized. Left Kidney: Length: 9.7 cm. Echogenicity within normal limits. No mass or hydronephrosis visualized. Abdominal aorta: No aneurysm visualized. Other findings: None. IMPRESSION: 7  mm hyperechoic focus seen in right hepatic lobe most consistent with hemangioma in the absence of any history of malignancy ; followup ultrasound in 6 months is recommended to ensure stability. If the patient does have a history of primary malignancy, then this is concerning for metastatic disease and further evaluation with MRI would be recommended. Minimal gallbladder wall thickening is noted without cholelithiasis or pericholecystic fluid. No sonographic Murphy's sign is noted. If there is clinical concern for cholecystitis, HIDA scan may be performed for further evaluation. Electronically Signed   By: Lupita Raider, M.D.   On: 03/19/2016 16:18   Procedures Procedures (including critical care time)  Medications Ordered in ED Medications  vancomycin (VANCOCIN) IVPB 1000 mg/200 mL premix (1,000 mg Intravenous New Bag/Given 03/19/16 1623)  piperacillin-tazobactam (ZOSYN) IVPB 3.375 g (3.375 g Intravenous New Bag/Given 03/19/16 1623)  sodium chloride 0.9 % bolus 1,632 mL (1,632 mLs Intravenous New Bag/Given 03/19/16 1236)     Initial Impression / Assessment and Plan / ED Course  I have  reviewed the triage vital signs and the nursing notes.  Pertinent labs & imaging results that were available during my care of the patient were reviewed by me and considered in my medical decision making (see chart for details).  Clinical Course   Stephania Sleppy Poinsett is a 30 y.o. female here with fever, chills. IV drug user and has cellulitis. Will do sepsis workup. Will give abx for cellulitis.   3 pm LFTs in the 700s. Mild RUQ tenderness. Will get RUQ [place]. Given zosyn/vanc for cellulitis. Acute hepatitis labs added.    4:32 PM US showed gallbladder wall thickening with no gallstones. Likely from hepatitis. UDs + amphetamines, benzos, cocaine, marijuana. Given vanc/zosyn. Will admit.   Final Clinical Impressions(s) / ED Diagnoses   Final diagnoses:  RUQ pain  Elevated LFTs    New Prescriptions New Prescriptions   No medications on file     Charlynne Pander, MD 03/19/16 1635

## 2016-03-19 NOTE — Consult Note (Signed)
-   Called about this patient. - Chart reviewed. - There is no GI on call for this weekend to follow this patient. - The patient has multiple risk factors for increased liver enzymes. - Hepatitis panel pending. - From the lab work it appears that this is hepatocellular injury and not obstruction - This kind of pattern can be seen with acute viral hepatitis and drugs. - I would recommend awaiting the lab work for possible causes of the abnormal liver enzymes. - Followed daily INR and if it starts increasing or if the patient has mental status changes then she should be transferred to a tertiary care center. If the patient remains stable and is still in the hospital on Monday she will be seen at that time.

## 2016-03-19 NOTE — ED Notes (Addendum)
Dr Pershing Proud and first nurse aware of pt. Code sepsis per dr Pershing Proud

## 2016-03-19 NOTE — Progress Notes (Signed)
Pharmacy Antibiotic Note  Kathleen Tate is a 30 y.o. female admitted on 03/19/2016 with sepsis.  Pharmacy has been consulted for Vancomycin dosing. Patient has a significant PMH of cocaine and IV drug use.   Patient is also receiving Zosyn 3.375 IV EI every 8 hours and  received vancomycin 1gm IV x1 dose in ED.  Plan: Ke: 0.055   T1/2; 12.6   Vd: 38    CrCl: 61   Scr: 1.03  Will start patient on Vancomycin 1gm IV every 18 hours with 9 hour stack dosing. Calculated trough at Css is 19. Trough ordered prior to 5th dose.  Will continue to monitor Scr and renal function and adjust dose as needed.   Height: 4\' 11"  (149.9 cm) Weight: 120 lb (54.4 kg) IBW/kg (Calculated) : 43.2  Temp (24hrs), Avg:99.2 F (37.3 C), Min:98.2 F (36.8 C), Max:100.4 F (38 C)   Recent Labs Lab 03/19/16 1227 03/19/16 1619  WBC 4.5  --   CREATININE 1.03*  --   LATICACIDVEN 1.0 1.1    Estimated Creatinine Clearance: 61.2 mL/min (by C-G formula based on SCr of 1.03 mg/dL).    Allergies  Allergen Reactions  . Cyclobenzaprine Nausea And Vomiting    Antimicrobials this admission: 7/28 Zosyn >>  7/28 Vancomycin >>   Dose adjustments this admission:   Microbiology results: 7/28 BCx:  7/28UCx:   Thank you for allowing pharmacy to be a part of this patient's care.  Cher Nakai, PharmD Clinical Pharmacist 03/19/2016 6:51 PM

## 2016-03-19 NOTE — ED Triage Notes (Signed)
Pt has had nausea vomiting for 2 days. Took 2 tylenol before arrival and still febrile. Diaphoretic. Iv drug use last done 2 days ago. Weak all over.

## 2016-03-19 NOTE — H&P (Signed)
[facility] - [place] at [facility]   PATIENT NAME: Kathleen Tate    MR#:  [id number]  DATE OF BIRTH:  1986/07/08  DATE OF ADMISSION:  03/19/2016  PRIMARY CARE PHYSICIAN: [facility]   REQUESTING/REFERRING PHYSICIAN: Charlynne Pander, MD  CHIEF COMPLAINT:  Fever and body aches  HISTORY OF PRESENT ILLNESS:  Kathleen Tate  is a 30 y.o. female with a known history of Depression and drug abuse with multiple needle track marks on the upper extremities is presenting to the ED with a chief complaint of fever and body aches. Reports she is not being herself from yesterday and in tears. AST and ALT are elevated. Abdominal ultrasound was done and patient is started on IV antibiotics. Patient states that she uses IV drugs used to snort cocaine but now shooting cocaine she also takes amphetamines as well as marijuana and drink alcohol patient just got released from jail and her 2  kids are with their grandmom. Patient denies any abdominal pain and wants to eat  PAST MEDICAL HISTORY:   Past Medical History:  Diagnosis Date  . Depression   . Drug abuse     PAST SURGICAL HISTOIRY:  History reviewed. No pertinent surgical history.  SOCIAL HISTORY:   Social History  Substance Use Topics  . Smoking status: Current Every Day Smoker    Packs/day: 0.50    Types: Cigarettes  . Smokeless tobacco: Not on file  . Alcohol use Yes    FAMILY HISTORY:  History reviewed. No pertinent family history.  DRUG ALLERGIES:   Allergies  Allergen Reactions  . Cyclobenzaprine Nausea And Vomiting    REVIEW OF SYSTEMS:  CONSTITUTIONAL: Reporting fever, fatigue and body aches EYES: No blurred or double vision.  EARS, NOSE, AND THROAT: No tinnitus or ear pain.  RESPIRATORY: No cough, shortness of breath, wheezing or hemoptysis.  CARDIOVASCULAR: No chest pain, orthopnea, edema.  GASTROINTESTINAL: No nausea, vomiting, diarrhea or abdominal pain.  GENITOURINARY: No  dysuria, hematuria.  ENDOCRINE: No polyuria, nocturia,  HEMATOLOGY: No anemia, easy bruising or bleeding SKIN: No rash or lesion. MUSCULOSKELETAL: No joint pain or arthritis.   NEUROLOGIC: No tingling, numbness, weakness.  PSYCHIATRY: No anxiety or depression.   MEDICATIONS AT HOME:   Prior to Admission medications   Medication Sig Start Date End Date Taking? Authorizing Provider  citalopram (CELEXA) 20 MG tablet Take 20 mg by mouth daily.   Yes Historical Provider, MD      VITAL SIGNS:  Blood pressure 101/84, pulse 88, temperature (!) 100.4 F (38 C), temperature source Oral, resp. rate 16, height  (1.499 m), weight 54.4 kg (120 lb), last menstrual period 03/04/2016, SpO2 100 %.  PHYSICAL EXAMINATION:  GENERAL:  30 y.o.-year-old patient lying in the bed with no acute distress.  EYES: Pupils equal, round, reactive to light and accommodation. No scleral icterus. Extraocular muscles intact.  HEENT: Head atraumatic, normocephalic. Oropharynx and nasopharynx clear.  NECK:  Supple, no jugular venous distention. No thyroid enlargement, no tenderness.  LUNGS: Normal breath sounds bilaterally, no wheezing, rales,rhonchi or crepitation. No use of accessory muscles of respiration.  CARDIOVASCULAR: S1, S2 normal. No murmurs, rubs, or gallops.  ABDOMEN: Soft, nontender, nondistended. Bowel sounds present. No organomegaly or mass.  EXTREMITIES: No pedal edema, cyanosis, or clubbing.  NEUROLOGIC: Cranial nerves II through XII are intact. Muscle strength 5/5 in all extremities. Sensation intact. Gait not checked.  [facility] and oriented x 3.  SKIN: No  obvious rash, lesion, or ulcer.   LABORATORY PANEL:   CBC  Recent Labs Lab 03/19/16 1227  WBC 4.5  HGB 15.6  HCT 45.0  PLT 139*   ------------------------------------------------------------------------------------------------------------------  Chemistries   Recent Labs Lab 03/19/16 1227  NA 136  K  3.0*  CL 102  CO2 25  GLUCOSE 112*  BUN 17  CREATININE 1.03*  CALCIUM 8.7*  AST 739*  ALT 405*  ALKPHOS 114  BILITOT 3.7*   ------------------------------------------------------------------------------------------------------------------  Cardiac Enzymes No results for input(s): TROPONINI in the last 168 hours. ------------------------------------------------------------------------------------------------------------------  RADIOLOGY:  Dg Chest 2 View  Result Date: 03/19/2016 CLINICAL DATA:  Nausea, vomiting.  Weakness. EXAM: CHEST  2 VIEW COMPARISON:  None. FINDINGS: The heart size and mediastinal contours are within normal limits. Both lungs are clear. No pneumothorax or pleural effusion is noted. Mild dextroscoliosis of thoracic spine is noted. IMPRESSION: No active cardiopulmonary disease. Electronically Signed   By: Lupita Raider, M.D.   On: 03/19/2016 13:42  US Abdomen Complete  Result Date: 03/19/2016 CLINICAL DATA:  Elevated liver function tests. Acute right upper quadrant abdominal pain. EXAM: ABDOMEN ULTRASOUND COMPLETE COMPARISON:  None. FINDINGS: Gallbladder: No gallstones are noted. Minimal gallbladder wall thickening at 3 mm is noted without pericholecystic fluid. No sonographic Murphy's sign is noted. Common bile duct: Diameter: 3 mm which is within normal limits. Liver: 7 mm hyperechoic focus is noted in right hepatic lobe. Within normal limits in parenchymal echogenicity. IVC: No abnormality visualized. Pancreas: Visualized portion unremarkable. Spleen: Size and appearance within normal limits. Right Kidney: Length: 10.5 cm. Echogenicity within normal limits. No mass or hydronephrosis visualized. Left Kidney: Length: 9.7 cm. Echogenicity within normal limits. No mass or hydronephrosis visualized. Abdominal aorta: No aneurysm visualized. Other findings: None. IMPRESSION: 7 mm hyperechoic focus seen in right hepatic lobe most consistent with hemangioma in the absence of  any history of malignancy ; followup ultrasound in 6 months is recommended to ensure stability. If the patient does have a history of primary malignancy, then this is concerning for metastatic disease and further evaluation with MRI would be recommended. Minimal gallbladder wall thickening is noted without cholelithiasis or pericholecystic fluid. No sonographic Murphy's sign is noted. If there is clinical concern for cholecystitis, HIDA scan may be performed for further evaluation. Electronically Signed   By: Lupita Raider, M.D.   On: 03/19/2016 16:18   EKG:   Orders placed or performed during the hospital encounter of 03/19/16  . EKG 12-Lead  . EKG 12-Lead  . EKG 12-Lead  . EKG 12-Lead    IMPRESSION AND PLAN:   Kathleen Tate  is a 30 y.o. female with a known history of Depression and drug abuse with multiple needle track marks on the upper extremities is presenting to the ED with a chief complaint of fever and body aches. Reports she is not being herself from yesterday and in tears. AST and ALT are elevated. Abdominal ultrasound was done and patient is started on IV antibiotics. Patient states that she uses IV drugs used to snort cocaine but now shooting cocaine she also takes amphetamines as well as marijuana and drink alcohol  # Sepsis secondary to Acute hepatitis-viral/alcoholic Meets septic criteria with fever and tachycardia Admit to MedSurg unit IV fluids and monitor LFTs closely Check hepatitis panel GI consult is placed Check alcohol level Avoid hepatotoxic meds Provide empiric antibiotics with intra-abdominal coverage  #Hypokalemia IV fluids with potassium supplements and recheck BMP in a.m.  #Generalized body aches-probably from  viral infection Pain management as needed and symptomatic treatment  #Depression-denies any suicidal or homicidal ideation Continue her home medications Celexa and outpatient follow-up with psychiatry as recommended  #Right hepatic lobe  lesion-probably hemangioma outpatient follow-up with oncology as recommended in 1-2 weeks  #History of alcohol abuse and drug abuse Patient needs rehabilitation Social service and case management consult is placed      All the records are reviewed and case discussed with ED provider. Management plans discussed with the patient, family and they are in agreement.  CODE STATUS: fc/mom  TOTAL TIME TAKING CARE OF THIS PATIENT: 45  minutes.   Note: This dictation was prepared with Dragon dictation along with smaller phrase technology. Any transcriptional errors that result from this process are unintentional.  Ramonita Lab M.D on 03/19/2016 at 5:30 PM  Between 7am to 6pm - Pager - [phone]  After 6pm go to [email or web address] - password [email or web address] [facility]  [place] [place] [facility]  Office  [phone]  CC: Primary care physician; [facility]

## 2016-03-19 NOTE — ED Notes (Signed)
Patient transported to Ultrasound 

## 2016-03-19 NOTE — Progress Notes (Signed)
Pt with complaints of headache. Dr. Anne Hahn to place orders.

## 2016-03-20 LAB — COMPREHENSIVE METABOLIC PANEL
ALT: 231 U/L — ABNORMAL HIGH (ref 14–54)
ANION GAP: 5 (ref 5–15)
AST: 233 U/L — AB (ref 15–41)
Albumin: 3.1 g/dL — ABNORMAL LOW (ref 3.5–5.0)
Alkaline Phosphatase: 88 U/L (ref 38–126)
BILIRUBIN TOTAL: 2.7 mg/dL — AB (ref 0.3–1.2)
BUN: 13 mg/dL (ref 6–20)
CO2: 26 mmol/L (ref 22–32)
Calcium: 7.5 mg/dL — ABNORMAL LOW (ref 8.9–10.3)
Chloride: 101 mmol/L (ref 101–111)
Creatinine, Ser: 0.77 mg/dL (ref 0.44–1.00)
GFR calc Af Amer: >60 mL/min (ref >60)
Glucose, Bld: 93 mg/dL (ref 65–99)
POTASSIUM: 4.3 mmol/L (ref 3.5–5.1)
Sodium: 132 mmol/L — ABNORMAL LOW (ref 135–145)
TOTAL PROTEIN: 6 g/dL — AB (ref 6.5–8.1)

## 2016-03-20 LAB — HEPATIC FUNCTION PANEL
ALBUMIN: 3.2 g/dL — AB (ref 3.5–5.0)
ALK PHOS: 92 U/L (ref 38–126)
ALT: 225 U/L — AB (ref 14–54)
AST: 216 U/L — AB (ref 15–41)
BILIRUBIN TOTAL: 2.3 mg/dL — AB (ref 0.3–1.2)
Bilirubin, Direct: 1.4 mg/dL — ABNORMAL HIGH (ref 0.1–0.5)
Indirect Bilirubin: 0.9 mg/dL (ref 0.3–0.9)
Total Protein: 6.1 g/dL — ABNORMAL LOW (ref 6.5–8.1)

## 2016-03-20 LAB — URINE CULTURE: CULTURE: NO GROWTH

## 2016-03-20 LAB — HCV RNA QUANT
HCV Quantitative Log: 3.433 log10 IU/mL (ref >1.70)
HCV Quantitative: 2710 IU/mL (ref >50)

## 2016-03-20 LAB — PROTIME-INR
INR: 1.28
PROTHROMBIN TIME: 16.1 s — AB (ref 11.4–15.2)

## 2016-03-20 LAB — HEPATITIS PANEL, ACUTE
HEP A IGM: NEGATIVE
HEP B C IGM: NEGATIVE
Hepatitis B Surface Ag: NEGATIVE

## 2016-03-20 LAB — CBC
HEMATOCRIT: 38 % (ref 35.0–47.0)
Hemoglobin: 13.1 g/dL (ref 12.0–16.0)
MCH: 31.1 pg (ref 26.0–34.0)
MCHC: 34.4 g/dL (ref 32.0–36.0)
MCV: 90.5 fL (ref 80.0–100.0)
Platelets: 94 10*3/uL — ABNORMAL LOW (ref 150–440)
RBC: 4.2 MIL/uL (ref 3.80–5.20)
RDW: 12.9 % (ref 11.5–14.5)
WBC: 3.3 10*3/uL — AB (ref 3.6–11.0)

## 2016-03-20 LAB — RAPID HIV SCREEN (HIV 1/2 AB+AG)
HIV 1/2 Antibodies: NONREACTIVE
HIV-1 P24 ANTIGEN - HIV24: NONREACTIVE

## 2016-03-20 LAB — LIPASE, BLOOD: LIPASE: 13 U/L (ref 11–51)

## 2016-03-20 LAB — HEPATITIS B SURFACE ANTIGEN: Hepatitis B Surface Ag: NEGATIVE

## 2016-03-20 LAB — AMYLASE: Amylase: 25 U/L — ABNORMAL LOW (ref 28–100)

## 2016-03-20 LAB — AMMONIA: Ammonia: 14 umol/L (ref 9–35)

## 2016-03-20 LAB — HEPATITIS A ANTIBODY, IGM: HEP A IGM: NEGATIVE

## 2016-03-20 MED ORDER — VANCOMYCIN HCL IN DEXTROSE 750-5 MG/150ML-% IV SOLN
750.0000 mg | Freq: Two times a day (BID) | INTRAVENOUS | Status: DC
  Administered 2016-03-20: 750 mg via INTRAVENOUS
  Filled 2016-03-20 (×3): qty 150

## 2016-03-20 MED ORDER — NYSTATIN 100000 UNIT/ML MT SUSP
5.0000 mL | Freq: Four times a day (QID) | OROMUCOSAL | Status: DC
  Administered 2016-03-20 (×3): 500000 [IU] via ORAL
  Filled 2016-03-20 (×3): qty 5

## 2016-03-20 MED ORDER — IBUPROFEN 400 MG PO TABS: 200.0000 mg | ORAL_TABLET | ORAL | Status: DC | PRN

## 2016-03-20 MED ORDER — SODIUM CHLORIDE 0.9 % IV SOLN
INTRAVENOUS | Status: DC
  Administered 2016-03-20: 10:00:00 via INTRAVENOUS

## 2016-03-20 NOTE — Progress Notes (Signed)
[facility] - [place] at [facility]   PATIENT NAME: Kathleen Tate    MR#:  [id number]  DATE OF BIRTH:  [birth year]  SUBJECTIVE:  CHIEF COMPLAINT:   Chief Complaint  Patient presents with  . Fever  The patient is 30 year old Caucasian female with past medical history significant for history of polysubstance abuse, depression, who presents to the hospital with complaints of muscle achiness, fever. On arrival to the hospital patient was noted to have elevated transaminase levels. Ultrasound of abdomen was performed revealing small hyperechoic focus in the right hepatic lobe, consistent with hemangioma, ultrasound in 6 months was recommended, gallbladder wall thickening was noted, no Murphy sign. Patient was admitted to the hospital for further evaluation and treatment. She was started on antibiotic therapy, IV fluids, and improved clinically. She feels better today  Review of Systems  Constitutional: Negative for chills, fever and weight loss.  HENT: Negative for congestion.   Eyes: Negative for blurred vision and double vision.  Respiratory: Negative for cough, sputum production, shortness of breath and wheezing.   Cardiovascular: Negative for chest pain, palpitations, orthopnea, leg swelling and PND.  Gastrointestinal: Negative for abdominal pain, blood in stool, constipation, diarrhea, nausea and vomiting.  Genitourinary: Negative for dysuria, frequency, hematuria and urgency.  Musculoskeletal: Negative for falls.  Neurological: Negative for dizziness, tremors, focal weakness and headaches.  Endo/Heme/Allergies: Does not bruise/bleed easily.  Psychiatric/Behavioral: Negative for depression. The patient does not have insomnia.     VITAL SIGNS: Blood pressure 97/66, pulse 90, temperature 98.6 F (37 C), temperature source Oral, resp. rate 16, height 4\' 11"  (1.499 m), weight 54.4 kg (120 lb), last menstrual period 03/04/2016, SpO2 100 %.  PHYSICAL EXAMINATION:    GENERAL:  30 y.o.-year-old patient lying in the bed with no acute distress.  EYES: Pupils equal, round, reactive to light and accommodation. No scleral icterus. Extraocular muscles intact.  HEENT: Head atraumatic, normocephalic. Oropharynx and nasopharynx clear.  NECK:  Supple, no jugular venous distention. No thyroid enlargement, no tenderness.  LUNGS: Normal breath sounds bilaterally, no wheezing, rales,rhonchi or crepitation. No use of accessory muscles of respiration.  CARDIOVASCULAR: S1, S2 normal. No murmurs, rubs, or gallops.  ABDOMEN: Soft, nontender, nondistended. Bowel sounds present. No organomegaly or mass.  EXTREMITIES: No pedal edema, cyanosis, or clubbing.  NEUROLOGIC: Cranial nerves II through XII are intact. Muscle strength 5/5 in all extremities. Sensation intact. Gait not checked.  PSYCHIATRIC: The patient is alert and oriented x 3.  SKIN: No obvious rash, lesion, or ulcer.   ORDERS/RESULTS REVIEWED:   CBC  Recent Labs Lab 03/19/16 1227 03/20/16 0408  WBC 4.5 3.3*  HGB 15.6 13.1  HCT 45.0 38.0  PLT 139* 94*  MCV 90.3 90.5  MCH 31.3 31.1  MCHC 34.7 34.4  RDW 13.3 12.9  LYMPHSABS 0.2*  --   MONOABS 0.1*  --   EOSABS 0.0  --   BASOSABS 0.0  --    ------------------------------------------------------------------------------------------------------------------  Chemistries   Recent Labs Lab 03/19/16 1227 03/20/16 0408  NA 136 132*  K 3.0* 4.3  CL 102 101  CO2 25 26  GLUCOSE 112* 93  BUN 17 13  CREATININE 1.03* 0.77  CALCIUM 8.7* 7.5*  AST 739* 233*  ALT 405* 231*  ALKPHOS 114 88  BILITOT 3.7* 2.7*   ------------------------------------------------------------------------------------------------------------------ estimated creatinine clearance is 78.8 mL/min (by C-G formula based on SCr of 0.8 mg/dL). ------------------------------------------------------------------------------------------------------------------ No results for input(s):  TSH, T4TOTAL, T3FREE, THYROIDAB in the last 72 hours.  Invalid  input(s): FREET3  Cardiac Enzymes No results for input(s): CKMB, TROPONINI, MYOGLOBIN in the last 168 hours.  Invalid input(s): CK ------------------------------------------------------------------------------------------------------------------ Invalid input(s): POCBNP ---------------------------------------------------------------------------------------------------------------  RADIOLOGY: Dg Chest 2 View  Result Date: 03/19/2016 CLINICAL DATA:  Nausea, vomiting.  Weakness. EXAM: CHEST  2 VIEW COMPARISON:  None. FINDINGS: The heart size and mediastinal contours are within normal limits. Both lungs are clear. No pneumothorax or pleural effusion is noted. Mild dextroscoliosis of thoracic spine is noted. IMPRESSION: No active cardiopulmonary disease. Electronically Signed   By: Lupita Raider, M.D.   On: 03/19/2016 13:42  US Abdomen Complete  Result Date: 03/19/2016 CLINICAL DATA:  Elevated liver function tests. Acute right upper quadrant abdominal pain. EXAM: ABDOMEN ULTRASOUND COMPLETE COMPARISON:  None. FINDINGS: Gallbladder: No gallstones are noted. Minimal gallbladder wall thickening at 3 mm is noted without pericholecystic fluid. No sonographic Murphy's sign is noted. Common bile duct: Diameter: 3 mm which is within normal limits. Liver: 7 mm hyperechoic focus is noted in right hepatic lobe. Within normal limits in parenchymal echogenicity. IVC: No abnormality visualized. Pancreas: Visualized portion unremarkable. Spleen: Size and appearance within normal limits. Right Kidney: Length: 10.5 cm. Echogenicity within normal limits. No mass or hydronephrosis visualized. Left Kidney: Length: 9.7 cm. Echogenicity within normal limits. No mass or hydronephrosis visualized. Abdominal aorta: No aneurysm visualized. Other findings: None. IMPRESSION: 7 mm hyperechoic focus seen in right hepatic lobe most consistent with hemangioma in the  absence of any history of malignancy ; followup ultrasound in 6 months is recommended to ensure stability. If the patient does have a history of primary malignancy, then this is concerning for metastatic disease and further evaluation with MRI would be recommended. Minimal gallbladder wall thickening is noted without cholelithiasis or pericholecystic fluid. No sonographic Murphy's sign is noted. If there is clinical concern for cholecystitis, HIDA scan may be performed for further evaluation. Electronically Signed   By: Lupita Raider, M.D.   On: 03/19/2016 16:18   EKG:  Orders placed or performed during the hospital encounter of 03/19/16  . EKG 12-Lead  . EKG 12-Lead  . EKG 12-Lead  . EKG 12-Lead    ASSESSMENT AND PLAN:  Active Problems:   Acute hepatitis #1. Acute hepatitis, etiology is unclear, could be viral, getting viral hepatitis panel, HIV testing, supportive therapy, gastroenterologist consultation is pending, follow LFTs in the morning, ammonia level, pro-time unremarkable #2. Hypotension, continue IV fluids, advance it as needed #3. Hyponatremia, continue IV fluids, follow sodium level in the morning #4 acute renal insufficiency, resolved with IV fluid administration #5. Leukopenia, getting viral hepatitis panel, HIV testing #6 Thrombocytopenia, questionable chronic underlying liver disease  Management plans discussed with the patient, family and they are in agreement.   DRUG ALLERGIES:  Allergies  Allergen Reactions  . Cyclobenzaprine Nausea And Vomiting    CODE STATUS:     Code Status Orders        Start     Ordered   03/19/16 1814  Full code  Continuous     03/19/16 1813    Code Status History    Date Active Date Inactive Code Status Order ID Comments User Context   This patient has a current code status but no historical code status.      TOTAL TIME TAKING CARE OF THIS PATIENT: 40 minutes.    Katharina Caper M.D on 03/20/2016 at 2:17 PM  Between 7am  to 6pm - Pager - [phone]  After 6pm go to [email or web address] - password [email or web address]  [facility]  Office  [phone]  CC: Primary care physician; [facility]

## 2016-03-20 NOTE — Clinical Social Work Note (Signed)
Clinical Social Work Assessment  Patient Details  Name: Kathleen Tate MRN: [id number] Date of Birth: 1985/09/04  Date of referral:  03/20/16               Reason for consult:  Substance Use/ETOH Abuse, Catering manager sought to share information with:    Permission granted to share information::     Name::        Agency::     Relationship::     Contact Information:     Housing/Transportation Living arrangements for the past 2 months:  No permanent address (Patient lives with her mother intermittently and her aunt and uncle when not at [employer]. ) Source of Information:  Patient Patient Interpreter Needed:  None Criminal Activity/Legal Involvement Pertinent to Current Situation/Hospitalization:  No - Comment as needed (Patient recently released from jail (June 2)) Significant Relationships:  Dependent Children, Media planner, Other Family Members (Most significant source of support from her mother-in-law (partner's mother).) Lives with:  Relatives Do you feel safe going back to the place where you live?  Yes Need for family participation in patient care:  No (Coment)  Care giving concerns: Patient has sepsis and viral hepatitis according to medical assessment. Patient has no permanent living arrangement and habitates with her mother or aunt depending on the situation at her mother's home. Patient feels safe returning to either domicile.   Social Worker assessment / plan:  CSW visited patient due to polysubstance use as evident by toxicology report. Patient admitted to using IV cocaine, smoked tobacco, smoked methamphetamines, and smoked marijuana. Patient verbally agreed to assessment.  Patient has used substances for over 2 years with IV use beginning in the past year. Patient has insight to triggers for use such as stress surrounding her familial relationships and her partner being incarcerated. Patient is anxious about losing custody of her children and  verbalized appreciation for her "mother-in-law" taking care of the children.   CSW discussed outpatient options that accept Medicaid ([facility] and [facility]). Patient was able to discuss in her own words the services that are provided at each location and how to use crisis services. Patient exhibited hope for the future, sorrow about the past, and a desire to achieve goals for financial security and sobriety.  Employment status:  Unemployed Health and safety inspector:  Medicaid In [place] PT Recommendations:  Not assessed at this time Information / Referral to community resources:     Patient/Family's Response to care:  Patient verbalized that her mother has ETOH "issues" and her father is currently homeless and cannot offer social support. Patient's aunt and uncle visited her and were leaving as CSW arrived at the room, having brought clothing and convenience items to the patient. Patient willing to discuss treatment options, and CSW witnessed patient cooperating with medical staff.  Patient/Family's Understanding of and Emotional Response to Diagnosis, Current Treatment, and Prognosis:  Patient tearful when discussing substance use, incarceration and current medical diagnoses. Patient was able to verbalize in her own words the options for treatment for substance use and depressions as well as her current medical diagnosis.  Emotional Assessment Appearance:  Appears older than stated age (Patient shared photos from earlier in the year and changes in body and face are apparent. Patient's skin is sallow and eyes are darkened.) Attitude/Demeanor/Rapport:  Crying, Guarded (Guarded and crying at beginning of engagement, became pleasant and open to discussion quickly.) Affect (typically  observed):  Afraid/Fearful, Anxious, Depressed, Grieving, Guarded, In denial, Tearful/Crying Orientation:  Oriented to Self, Oriented to Place, Oriented to  Time, Oriented to Situation Alcohol / Substance use:   Illicit Drugs, Alcohol Use Psych involvement (Current and /or in the community):  No (Comment)  Discharge Needs  Concerns to be addressed:  Coping/Stress Concerns, Mental Health Concerns, Substance Abuse Concerns Readmission within the last 30 days:  No Current discharge risk:  Substance Abuse, Lack of support system Barriers to Discharge:  No Barriers Identified   Judi Cong, LCSW 03/20/2016, 3:56 PM

## 2016-03-20 NOTE — Progress Notes (Signed)
Pharmacy Antibiotic Note  Kathleen Tate is a 30 y.o. female admitted on 03/19/2016 with sepsis.  Pharmacy has been consulted for Vancomycin dosing. Patient has a significant PMH of cocaine and IV drug use.   Patient is also receiving Zosyn 3.375 IV EI every 8 hours and  received vancomycin 1gm IV x1 dose in ED.  Plan: Ke: 0.055   T1/2; 12.6   Vd: 38    CrCl: 61   Scr: 1.03  Will start patient on Vancomycin 1gm IV every 18 hours with 9 hour stack dosing. Calculated trough at Css is 19. Trough ordered prior to 5th dose.   7/29- Scr improved. PK: Ke=0.037  T1/2 10.3  Vd 38.  Will transition to Vancomycin 750mg  IV q12h. Trough 7/30 at 1330. Will continue to monitor Scr and renal function and adjust dose as needed.   Height: 4\' 11"  (149.9 cm) Weight: 120 lb (54.4 kg) IBW/kg (Calculated) : 43.2  Temp (24hrs), Avg:99 F (37.2 C), Min:98.2 F (36.8 C), Max:100.4 F (38 C)   Recent Labs Lab 03/19/16 1227 03/19/16 1619 03/20/16 0408  WBC 4.5  --  3.3*  CREATININE 1.03*  --  0.77  LATICACIDVEN 1.0 1.1  --     Estimated Creatinine Clearance: 78.8 mL/min (by C-G formula based on SCr of 0.8 mg/dL).    Allergies  Allergen Reactions  . Cyclobenzaprine Nausea And Vomiting    Antimicrobials this admission: 7/28 Zosyn >>  7/28 Vancomycin >>   Dose adjustments this admission:  7/19- renal fxn improved. Vanc 1 gram q18h changed to 750 q12h.   Microbiology results: 7/28 BCx: pending 7/28UCx:  Pending 7/29- throat cx:  Thank you for allowing pharmacy to be a part of this patient's care.  Bari Mantis PharmD Clinical Pharmacist 03/20/2016 10:27 AM

## 2016-03-20 NOTE — Progress Notes (Signed)
Pt wants to leave AMA because she can't go out to smoke. Dr Eliane Decree notified.

## 2016-03-20 NOTE — Progress Notes (Signed)
Explained to pt the benefits of staying in the hospital until morning rounds are made. She stated she didn't understand why we would not let her go smoke when other units will let patients go out to smoke. The nsg supervisor was present and told her she could not leave the floor to smoke and return back to her room. She signed out AMA.

## 2016-03-21 ENCOUNTER — Encounter: Payer: Self-pay | Admitting: Emergency Medicine

## 2016-03-21 ENCOUNTER — Emergency Department
Admission: EM | Admit: 2016-03-21 | Discharge: 2016-03-21 | Disposition: A | Discharge status: Home / Self Care | Payer: Medicaid Other | Attending: Emergency Medicine | Admitting: Emergency Medicine

## 2016-03-21 DIAGNOSIS — I959 Hypotension, unspecified: Secondary | ICD-10-CM

## 2016-03-21 DIAGNOSIS — D696 Thrombocytopenia, unspecified: Secondary | ICD-10-CM

## 2016-03-21 DIAGNOSIS — B1920 Unspecified viral hepatitis C without hepatic coma: Secondary | ICD-10-CM

## 2016-03-21 DIAGNOSIS — F17210 Nicotine dependence, cigarettes, uncomplicated: Secondary | ICD-10-CM | POA: Insufficient documentation

## 2016-03-21 DIAGNOSIS — E871 Hypo-osmolality and hyponatremia: Secondary | ICD-10-CM

## 2016-03-21 DIAGNOSIS — D72819 Decreased white blood cell count, unspecified: Secondary | ICD-10-CM

## 2016-03-21 DIAGNOSIS — R1110 Vomiting, unspecified: Secondary | ICD-10-CM | POA: Diagnosis present

## 2016-03-21 DIAGNOSIS — N289 Disorder of kidney and ureter, unspecified: Secondary | ICD-10-CM

## 2016-03-21 LAB — CBC
HCT: 38.4 % (ref 35.0–47.0)
Hemoglobin: 13.5 g/dL (ref 12.0–16.0)
MCH: 31.1 pg (ref 26.0–34.0)
MCHC: 35 g/dL (ref 32.0–36.0)
MCV: 88.7 fL (ref 80.0–100.0)
PLATELETS: 130 10*3/uL — AB (ref 150–440)
RBC: 4.33 MIL/uL (ref 3.80–5.20)
RDW: 13.1 % (ref 11.5–14.5)
WBC: 4 10*3/uL (ref 3.6–11.0)

## 2016-03-21 LAB — LIPASE, BLOOD: Lipase: 19 U/L (ref 11–51)

## 2016-03-21 LAB — COMPREHENSIVE METABOLIC PANEL
ALT: 192 U/L — AB (ref 14–54)
AST: 139 U/L — AB (ref 15–41)
Albumin: 3.7 g/dL (ref 3.5–5.0)
Alkaline Phosphatase: 203 U/L — ABNORMAL HIGH (ref 38–126)
Anion gap: 8 (ref 5–15)
BILIRUBIN TOTAL: 3.9 mg/dL — AB (ref 0.3–1.2)
BUN: 12 mg/dL (ref 6–20)
CHLORIDE: 102 mmol/L (ref 101–111)
CO2: 27 mmol/L (ref 22–32)
CREATININE: 0.64 mg/dL (ref 0.44–1.00)
Calcium: 8.8 mg/dL — ABNORMAL LOW (ref 8.9–10.3)
GFR calc Af Amer: >60 mL/min (ref >60)
GLUCOSE: 99 mg/dL (ref 65–99)
Potassium: 3.2 mmol/L — ABNORMAL LOW (ref 3.5–5.1)
Sodium: 137 mmol/L (ref 135–145)
TOTAL PROTEIN: 7 g/dL (ref 6.5–8.1)

## 2016-03-21 MED ORDER — SODIUM CHLORIDE 0.9 % IV BOLUS (SEPSIS)
1000.0000 mL | Freq: Once | INTRAVENOUS | Status: AC
Start: 2016-03-21 — End: 2016-03-21
  Administered 2016-03-21: 1000 mL via INTRAVENOUS

## 2016-03-21 MED ORDER — ONDANSETRON 4 MG PO TBDP: 4.0000 mg | ORAL_TABLET | Freq: Three times a day (TID) | ORAL | 0 refills | Status: DC | PRN

## 2016-03-21 MED ORDER — ONDANSETRON HCL 4 MG/2ML IJ SOLN
4.0000 mg | Freq: Once | INTRAMUSCULAR | Status: AC
  Administered 2016-03-21: 4 mg via INTRAVENOUS
  Filled 2016-03-21: qty 2

## 2016-03-21 MED ORDER — GI COCKTAIL ~~LOC~~
30.0000 mL | Freq: Once | ORAL | Status: AC
  Administered 2016-03-21: 30 mL via ORAL
  Filled 2016-03-21: qty 30

## 2016-03-21 NOTE — ED Provider Notes (Signed)
[facility] Emergency Department Provider Note  Time seen: 11:03 AM  I have reviewed the triage vital signs and the nursing notes.   HISTORY  Chief Complaint Emesis    HPI Kathleen Tate is a 30 y.o. female with a past medical history of polysubstance abuse including IV drug use, depression, who presents to the emergency department for evaluation. According to the patient she was seen in the emergency department 2 days ago for abdominal pain and fever, at that time she met sepsis criteria she was started on empiric anabiotic admitted to the hospital. Patient states she left AMA last night.  Per record review the patient states she wanted to go smoke cigarettes,she was denied the ability to go smoke cigarettes so she signed out [place]. Patient admits to using drugs after leaving the hospital. Patient states he came back today because she was worried that she left the hospital too soon. Patient denies any abdominal pain today. Denies any fever today. States she is feeling much better than she did several days ago.  Past Medical History:  Diagnosis Date  . Depression   . Drug abuse     Patient Active Problem List   Diagnosis Date Noted  . Acute hepatitis 03/19/2016    History reviewed. No pertinent surgical history.  Prior to Admission medications   Medication Sig Start Date End Date Taking? Authorizing Provider  citalopram (CELEXA) 20 MG tablet Take 20 mg by mouth daily.    Historical Provider, MD    Allergies  Allergen Reactions  . Cyclobenzaprine Nausea And Vomiting    History reviewed. No pertinent family history.  Social History Social History  Substance Use Topics  . Smoking status: Current Every Day Smoker    Packs/day: 0.50    Types: Cigarettes  . Smokeless tobacco: Never Used  . Alcohol use 1.2 oz/week    2 Standard drinks or equivalent per week     Comment: weekends at times    Review of Systems Constitutional:  Negative for fever. Cardiovascular: Negative for chest pain. Respiratory: Negative for shortness of breath. Gastrointestinal: Negative for abdominal pain Musculoskeletal: Negative for back pain. Neurological: Negative for headaches, focal weakness or numbness. 10-point ROS otherwise negative.  ____________________________________________   PHYSICAL EXAM:  VITAL SIGNS: ED Triage Vitals  Enc Vitals Group     BP 03/21/16 0931 115/82     Pulse Rate 03/21/16 0931 (!) 103     Resp 03/21/16 0931 18     Temp 03/21/16 0931 98.3 F (36.8 C)     Temp Source 03/21/16 0931 Oral     SpO2 03/21/16 0931 98 %     Weight 03/21/16 0931 125 lb (56.7 kg)     Height 03/21/16 0931 4' 11"  (1.499 m)     Head Circumference --      Peak Flow --      Pain Score 03/21/16 0932 4     Pain Loc --      Pain Edu? --      Excl. in [place]? --     Constitutional: Alert and oriented. Well appearing and in no distress. Eyes: Normal exam ENT   Head: Normocephalic and atraumatic   Mouth/Throat: Mucous membranes are moist. Cardiovascular: Normal rate, regular rhythm. No murmur Respiratory: Normal respiratory effort without tachypnea nor retractions. Breath sounds are clear Gastrointestinal: Soft and nontender. No distention.  Musculoskeletal: Nontender with normal range of motion in all extremities. Track marks bilaterally in the upper extremities, no  signs of infection or cellulitis. Neurologic:  Normal speech and language. No gross focal neurologic deficits are appreciated. Skin:  Skin is warm, dry and intact.  Psychiatric: Mood and affect are normal. Speech and behavior are normal.   ____________________________________________    INITIAL IMPRESSION / ASSESSMENT AND PLAN / ED COURSE  Pertinent labs & imaging results that were available during my care of the patient were reviewed by me and considered in my medical decision making (see chart for details).  I reviewed the patient's records as well as  spoke to the hospitalist who is caring for the patient during her stay in the hospital. The patient was admitted for fever and abdominal pain. Her workup was significant for hepatitis C, possible acute hepatitis causing her issues. The rest of her workup is largely normal. Her blood cultures were negative. Urine cultures were negative. HIV was negative. At this time the labs appear to have improved, her sodium is normal. Her white count is normal. After discussing with the hospitalist in reviewing the patient's records myself I do not see a need for inpatient hospitalization at this time. I discussed this with the patient as well as the need to follow up with her primary care doctor. The patient is agreeable to this plan.  ____________________________________________   FINAL CLINICAL IMPRESSION(S) / ED DIAGNOSES  Normal physical exam Hepatitis C    Harvest Dark, MD 03/21/16 1108

## 2016-03-21 NOTE — Discharge Summary (Signed)
[facility] - [place] at [facility]   PATIENT NAME: Kathleen Tate    MR#:  [id number]  DATE OF BIRTH:  01/26/1986  DATE OF ADMISSION:  03/19/2016 ADMITTING PHYSICIAN: Ramonita Lab, MD  DATE OF DISCHARGE: 03/20/2016  9:05 PM  PRIMARY CARE PHYSICIAN: Delton Prairie, MD     ADMISSION DIAGNOSIS:  Hepatitis [K75.9] RUQ pain [R10.11] Elevated LFTs [R79.89] Cellulitis of left upper extremity [L03.114] Cellulitis of right upper extremity [L03.113]  DISCHARGE DIAGNOSIS:  Active Problems:   Acute hepatitis   SECONDARY DIAGNOSIS:   Past Medical History:  Diagnosis Date  . Depression   . Drug abuse     .pro HOSPITAL COURSE:  The patient is 30 year old Caucasian female with past medical history significant for history of polysubstance abuse, depression, who presents to the hospital with complaints of muscle achiness, fever. On arrival to the hospital patient was noted to have elevated transaminase levels. Ultrasound of abdomen was performed revealing small hyperechoic focus in the right hepatic lobe, consistent with hemangioma, ultrasound in 6 months was recommended, gallbladder wall thickening was noted, no Murphy sign. Patient was admitted to the hospital for further evaluation and treatment. She was started on  IV fluids, and improved clinically. Viral hepatitis studies as well as HIV were taken, pending. Alcohol level was less than 5. Urine drug screen was positive for amphetamines, benzodiazepines, cocaine, cannabinoids. This conservative therapy she improved, her labs also improved, however, she decided to leave AGAINST MEDICAL ADVICE. Discussion by problem: #1. Acute hepatitis, etiology is unclear, could be viral, obtained viral hepatitis panel, HIV testing, supportive therapy was provided, gastroenterologist was not available during her stay in the hospital time for consultation, although Dr. Servando Snare, provided he's input from the distance, with conservative therapy LFTs  improved, ammonia level, pro-time were unremarkable, patient may benefit from a gastroenterologist follow-up as outpatient. Patient was advised to stop using drugs.  #2. Hypotension, resolved with IV fluids, likely mild dehydration related #3. Hyponatremia, questionable dehydration, she was continued on IV fluids while in the hospital, it is recommended to follow sodium level as outpatient #4 acute renal insufficiency, resolved with IV fluid administration #5. Leukopenia, obtained viral hepatitis panel, HIV testing, pending #6 Thrombocytopenia, questionable chronic underlying liver disease  DISCHARGE CONDITIONS:   Stable  CONSULTS OBTAINED:  Treatment Team:  Midge Minium, MD  DRUG ALLERGIES:   Allergies  Allergen Reactions  . Cyclobenzaprine Nausea And Vomiting    DISCHARGE MEDICATIONS:   Discharge Medication List as of 03/20/2016 10:27 PM    CONTINUE these medications which have NOT CHANGED   Details  citalopram (CELEXA) 20 MG tablet Take 20 mg by mouth daily., Historical Med         DISCHARGE INSTRUCTIONS:    Patient is to follow-up with primary care physician, gastroenterologist as outpatient  If you experience worsening of your admission symptoms, develop shortness of breath, life threatening emergency, suicidal or homicidal thoughts you must seek medical attention immediately by calling 911 or calling your MD immediately  if symptoms less severe.  You Must read complete instructions/literature along with all the possible adverse reactions/side effects for all the Medicines you take and that have been prescribed to you. Take any new Medicines after you have completely understood and accept all the possible adverse reactions/side effects.   Please note  You were cared for by a hospitalist during your hospital stay. If you have any questions about your discharge medications or the care you received while you were in the hospital  after you are discharged, you can call the  unit and asked to speak with the hospitalist on call if the hospitalist that took care of you is not available. Once you are discharged, your primary care physician will handle any further medical issues. Please note that NO REFILLS for any discharge medications will be authorized once you are discharged, as it is imperative that you return to your primary care physician (or establish a relationship with a primary care physician if you do not have one) for your aftercare needs so that they can reassess your need for medications and monitor your lab values.    Today   CHIEF COMPLAINT:   Chief Complaint  Patient presents with  . Fever    HISTORY OF PRESENT ILLNESS:  Kathleen Tate  is a 30 y.o. female with a known history of polysubstance abuse, depression, who presents to the hospital with complaints of muscle achiness, fever. On arrival to the hospital patient was noted to have elevated transaminase levels. Ultrasound of abdomen was performed revealing small hyperechoic focus in the right hepatic lobe, consistent with hemangioma, ultrasound in 6 months was recommended, gallbladder wall thickening was noted, no Murphy sign. Patient was admitted to the hospital for further evaluation and treatment. She was started on  IV fluids, and improved clinically. Viral hepatitis studies as well as HIV were taken, pending. Alcohol level was less than 5. Urine drug screen was positive for amphetamines, benzodiazepines, cocaine, cannabinoids. This conservative therapy she improved, her labs also improved, however, she decided to leave AGAINST MEDICAL ADVICE. Discussion by problem: #1. Acute hepatitis, etiology is unclear, could be viral, obtained viral hepatitis panel, HIV testing, supportive therapy was provided, gastroenterologist was not available during her stay in the hospital time for consultation, although Dr. Servando Snare, provided he's input from the distance, with conservative therapy LFTs improved, ammonia level,  pro-time were unremarkable, patient may benefit from a gastroenterologist follow-up as outpatient. Patient was advised to stop using drugs.  #2. Hypotension, resolved with IV fluids, likely mild dehydration related #3. Hyponatremia, questionable dehydration, she was continued on IV fluids while in the hospital, it is recommended to follow sodium level as outpatient #4 acute renal insufficiency, resolved with IV fluid administration #5. Leukopenia, obtained viral hepatitis panel, HIV testing, pending #6 Thrombocytopenia, questionable chronic underlying liver disease    VITAL SIGNS:  Blood pressure 102/77, pulse (!) 106, temperature 98.3 F (36.8 C), temperature source Oral, resp. rate 18, height 4\' 11"  (1.499 m), weight 54.4 kg (120 lb), last menstrual period 03/04/2016, SpO2 100 %.  I/O:   Intake/Output Summary (Last 24 hours) at 03/21/16 1313 Last data filed at 03/20/16 1802  Gross per 24 hour  Intake           776.67 ml  Output                0 ml  Net           776.67 ml    PHYSICAL EXAMINATION:  GENERAL:  30 y.o.-year-old patient lying in the bed with no acute distress.  EYES: Pupils equal, round, reactive to light and accommodation. No scleral icterus. Extraocular muscles intact.  HEENT: Head atraumatic, normocephalic. Oropharynx and nasopharynx clear.  NECK:  Supple, no jugular venous distention. No thyroid enlargement, no tenderness.  LUNGS: Normal breath sounds bilaterally, no wheezing, rales,rhonchi or crepitation. No use of accessory muscles of respiration.  CARDIOVASCULAR: S1, S2 normal. No murmurs, rubs, or gallops.  ABDOMEN: Soft, non-tender, non-distended. Bowel sounds present.  No organomegaly or mass.  EXTREMITIES: No pedal edema, cyanosis, or clubbing.  NEUROLOGIC: Cranial nerves II through XII are intact. Muscle strength 5/5 in all extremities. Sensation intact. Gait not checked.  PSYCHIATRIC: The patient is alert and oriented x 3.  SKIN: No obvious rash, lesion, or  ulcer.   DATA REVIEW:   CBC  Recent Labs Lab 03/21/16 0935  WBC 4.0  HGB 13.5  HCT 38.4  PLT 130*    Chemistries   Recent Labs Lab 03/21/16 0935  NA 137  K 3.2*  CL 102  CO2 27  GLUCOSE 99  BUN 12  CREATININE 0.64  CALCIUM 8.8*  AST 139*  ALT 192*  ALKPHOS 203*  BILITOT 3.9*    Cardiac Enzymes No results for input(s): TROPONINI in the last 168 hours.  Microbiology Results  Results for orders placed or performed during the hospital encounter of 03/19/16  Culture, blood (Routine x 2)     Status: None (Preliminary result)   Collection Time: 03/19/16 12:27 PM  Result Value Ref Range Status   Specimen Description BLOOD RIGHT ARM  Final   Special Requests BOTTLES DRAWN AEROBIC AND ANAEROBIC  3CC  Final   Culture NO GROWTH 2 DAYS  Final   Report Status PENDING  Incomplete  Urine culture     Status: None   Collection Time: 03/19/16 12:27 PM  Result Value Ref Range Status   Specimen Description URINE, RANDOM  Final   Special Requests NONE  Final   Culture NO GROWTH Performed at [facility]   Final   Report Status 03/20/2016 FINAL  Final    RADIOLOGY:  Dg Chest 2 View  Result Date: 03/19/2016 CLINICAL DATA:  Nausea, vomiting.  Weakness. EXAM: CHEST  2 VIEW COMPARISON:  None. FINDINGS: The heart size and mediastinal contours are within normal limits. Both lungs are clear. No pneumothorax or pleural effusion is noted. Mild dextroscoliosis of thoracic spine is noted. IMPRESSION: No active cardiopulmonary disease. Electronically Signed   By: Lupita Raider, M.D.   On: 03/19/2016 13:42  US Abdomen Complete  Result Date: 03/19/2016 CLINICAL DATA:  Elevated liver function tests. Acute right upper quadrant abdominal pain. EXAM: ABDOMEN ULTRASOUND COMPLETE COMPARISON:  None. FINDINGS: Gallbladder: No gallstones are noted. Minimal gallbladder wall thickening at 3 mm is noted without pericholecystic fluid. No sonographic Murphy's sign is noted. Common bile duct:  Diameter: 3 mm which is within normal limits. Liver: 7 mm hyperechoic focus is noted in right hepatic lobe. Within normal limits in parenchymal echogenicity. IVC: No abnormality visualized. Pancreas: Visualized portion unremarkable. Spleen: Size and appearance within normal limits. Right Kidney: Length: 10.5 cm. Echogenicity within normal limits. No mass or hydronephrosis visualized. Left Kidney: Length: 9.7 cm. Echogenicity within normal limits. No mass or hydronephrosis visualized. Abdominal aorta: No aneurysm visualized. Other findings: None. IMPRESSION: 7 mm hyperechoic focus seen in right hepatic lobe most consistent with hemangioma in the absence of any history of malignancy ; followup ultrasound in 6 months is recommended to ensure stability. If the patient does have a history of primary malignancy, then this is concerning for metastatic disease and further evaluation with MRI would be recommended. Minimal gallbladder wall thickening is noted without cholelithiasis or pericholecystic fluid. No sonographic Murphy's sign is noted. If there is clinical concern for cholecystitis, HIDA scan may be performed for further evaluation. Electronically Signed   By: Lupita Raider, M.D.   On: 03/19/2016 16:18   EKG:   Orders placed or performed  during the hospital encounter of 03/19/16  . EKG 12-Lead  . EKG 12-Lead  . EKG 12-Lead  . EKG 12-Lead      Management plans discussed with the patient, family and they are in agreement.  CODE STATUS:  Code Status History    Date Active Date Inactive Code Status Order ID Comments User Context   03/19/2016  6:13 PM 03/21/2016  1:32 AM Full Code [id number]  Ramonita Lab, MD Inpatient      TOTAL TIME TAKING CARE OF THIS PATIENT: 40 minutes.    Katharina Caper M.D on 03/21/2016 at 1:13 PM  Between 7am to 6pm - Pager - [phone]  After 6pm go to [email or web address] - password [email or web address] [facility]  [facility]  Office  [phone]  CC: Primary care  physician; Delton Prairie, MD

## 2016-03-21 NOTE — ED Notes (Signed)
Will d/c when fluids finished infusing.

## 2016-03-21 NOTE — ED Notes (Signed)
Pt states she regrets signing herself out last night. Pt states she had 4 bags of fluid and 3 bags of antibiotics. After having an argument with her mother in law she signed herself out AMA. Pt states she should have stayed because the "infection could kill her".

## 2016-03-21 NOTE — ED Triage Notes (Addendum)
Patient arrives to [facility] ED via POV with complaint of N/V for 3 hours. Patient was an inpatient at this facility and left AMA due to desire to smoke. Patient admitted to leaving this facility and using "[facility]". Patient requesting inpatient detox

## 2016-03-22 LAB — BLOOD CULTURE ID PANEL (REFLEXED)
Acinetobacter baumannii: NOT DETECTED
CANDIDA ALBICANS: NOT DETECTED
CANDIDA GLABRATA: NOT DETECTED
CANDIDA KRUSEI: NOT DETECTED
CANDIDA PARAPSILOSIS: NOT DETECTED
Candida tropicalis: NOT DETECTED
Carbapenem resistance: NOT DETECTED
ENTEROBACTER CLOACAE COMPLEX: NOT DETECTED
ENTEROBACTERIACEAE SPECIES: NOT DETECTED
ESCHERICHIA COLI: NOT DETECTED
Enterococcus species: NOT DETECTED
Haemophilus influenzae: NOT DETECTED
KLEBSIELLA OXYTOCA: NOT DETECTED
KLEBSIELLA PNEUMONIAE: NOT DETECTED
Listeria monocytogenes: NOT DETECTED
Methicillin resistance: NOT DETECTED
Neisseria meningitidis: NOT DETECTED
PSEUDOMONAS AERUGINOSA: NOT DETECTED
Proteus species: NOT DETECTED
STREPTOCOCCUS AGALACTIAE: NOT DETECTED
STREPTOCOCCUS PNEUMONIAE: NOT DETECTED
STREPTOCOCCUS SPECIES: NOT DETECTED
Serratia marcescens: NOT DETECTED
Staphylococcus aureus (BCID): NOT DETECTED
Staphylococcus species: NOT DETECTED
Streptococcus pyogenes: NOT DETECTED
Vancomycin resistance: NOT DETECTED

## 2016-03-22 LAB — CULTURE, GROUP A STREP (THRC)

## 2016-03-22 NOTE — Progress Notes (Signed)
ED Antimicrobial Stewardship Positive Culture Follow Up   Kathleen Tate is an 30 y.o. female who presented to [facility] on 03/19/2016 with a chief complaint of  Chief Complaint  Patient presents with  . Fever   Patient has history of polysubstance abuse including IVDA. Pt was admitted with acute hepititis (viral vs alcoholic). During admission patient was afebrile and patient left AMA. Patient returned to the ED on 7/30. Hospitalists and ED physician agreed patient did not need inpatient treatment, appeared well with normal labs.  Pharmacy called on 07/31 @1800  with one of two (aerobic) blood cultures on 7/28 revealed gram positive cocci and no organisms detected on BCID. After discussion with Dr. Auburn Bilberry, no action to be taken at this time it is believed to be a contaminant.   Horris Latino, PharmD Pharmacy Resident 03/22/2016 6:25 PM

## 2016-03-24 LAB — CULTURE, BLOOD (ROUTINE X 2)

## 2016-04-05 ENCOUNTER — Ambulatory Visit (INDEPENDENT_AMBULATORY_CARE_PROVIDER_SITE_OTHER): Payer: Medicaid Other | Admitting: Family Medicine

## 2016-04-05 ENCOUNTER — Encounter: Payer: Self-pay | Admitting: Family Medicine

## 2016-04-05 VITALS — BP 120/82 | HR 72 | Temp 98.3°F | Ht 58.6 in | Wt 126.0 lb

## 2016-04-05 DIAGNOSIS — D696 Thrombocytopenia, unspecified: Secondary | ICD-10-CM | POA: Diagnosis not present

## 2016-04-05 DIAGNOSIS — B1920 Unspecified viral hepatitis C without hepatic coma: Secondary | ICD-10-CM | POA: Insufficient documentation

## 2016-04-05 DIAGNOSIS — D72819 Decreased white blood cell count, unspecified: Secondary | ICD-10-CM | POA: Diagnosis not present

## 2016-04-05 DIAGNOSIS — F1990 Other psychoactive substance use, unspecified, uncomplicated: Secondary | ICD-10-CM | POA: Diagnosis not present

## 2016-04-05 DIAGNOSIS — K7200 Acute and subacute hepatic failure without coma: Secondary | ICD-10-CM | POA: Diagnosis not present

## 2016-04-05 DIAGNOSIS — B179 Acute viral hepatitis, unspecified: Secondary | ICD-10-CM

## 2016-04-05 DIAGNOSIS — J4520 Mild intermittent asthma, uncomplicated: Secondary | ICD-10-CM

## 2016-04-05 DIAGNOSIS — B1710 Acute hepatitis C without hepatic coma: Secondary | ICD-10-CM | POA: Diagnosis not present

## 2016-04-05 MED ORDER — ALBUTEROL SULFATE HFA 108 (90 BASE) MCG/ACT IN AERS: 2.0000 | INHALATION_SPRAY | Freq: Four times a day (QID) | RESPIRATORY_TRACT | 12 refills | Status: DC | PRN

## 2016-04-05 NOTE — Progress Notes (Signed)
Name: Kathleen Tate   MRN: [id number]    DOB: [birth year]   Date:04/05/2016       Progress Note  Subjective  Chief Complaint  Chief Complaint  Patient presents with  . Establish Care  . Hepatitis C    HPI Here to establish care.  She was dx'd with acute hepatitis and Hep C at [facility].  Has hx of IV drug use with cocaine, Meth and "Moly".  Also snorted cocaine and meth and smoked marijuana.  She reports that she plans to be off all drugs and did smoke marijuana 3 days ago..  She still smokes cigs (1 ppd).  She drinks occasional ETOH.  She gets dizzy and anxious. Has had some blurred vision.   She is G2 P2.   No problem-specific Assessment & Plan notes found for this encounter.   Past Medical History:  Diagnosis Date  . Depression   . Drug abuse   . Hepatitis C     No past surgical history on file.  Family History  Problem Relation Age of Onset  . Lung cancer Mother   . Lung cancer Father     Social History   Social History  . Marital status: Single    Spouse name: N/A  . Number of children: 2  . Years of education: N/A   Occupational History  . Not on file.   Social History Main Topics  . Smoking status: Current Every Day Smoker    Packs/day: 0.50    Types: Cigarettes  . Smokeless tobacco: Never Used  . Alcohol use 1.2 oz/week    2 Standard drinks or equivalent per week     Comment: weekends at times  . Drug use:     Types: Cocaine, Marijuana, Amphetamines, MDMA (Ecstacy)  . Sexual activity: Not on file   Other Topics Concern  . Not on file   Social History Narrative  . No narrative on file     Current Outpatient Prescriptions:  .  albuterol (PROVENTIL HFA;VENTOLIN HFA) 108 (90 Base) MCG/ACT inhaler, Inhale 2 puffs into the lungs every 6 (six) hours as needed for wheezing or shortness of breath., Disp: 1 Inhaler, Rfl: 12 .  citalopram (CELEXA) 20 MG tablet, Take 20 mg by mouth daily., Disp: , Rfl:  .  ondansetron (ZOFRAN ODT) 4 MG disintegrating tablet,  Take 1 tablet (4 mg total) by mouth every 8 (eight) hours as needed for nausea or vomiting., Disp: 20 tablet, Rfl: 0  Allergies  Allergen Reactions  . Cyclobenzaprine Nausea And Vomiting     Review of Systems  Constitutional: Positive for malaise/fatigue and weight loss. Negative for chills and fever.  HENT: Negative for hearing loss.   Eyes: Positive for blurred vision. Negative for double vision.  Respiratory: Negative for cough, shortness of breath and wheezing.   Cardiovascular: Negative for chest pain, palpitations and leg swelling.  Gastrointestinal: Negative for abdominal pain, blood in stool and heartburn.  Genitourinary: Negative for dysuria, frequency and urgency.  Musculoskeletal: Positive for myalgias. Negative for joint pain.  Skin: Negative for rash.  Neurological: Negative for dizziness, tremors, weakness and headaches.  Psychiatric/Behavioral: The patient is nervous/anxious.       Objective  Vitals:   04/05/16 1008  BP: 120/82  Pulse: 72  Temp: 98.3 F (36.8 C)  TempSrc: Oral  Weight: 126 lb (57.2 kg)  Height: 4' 10.6" (1.488 m)    Physical Exam  Constitutional: She is oriented to person, place, and time and well-developed, well-nourished,  and in no distress. No distress.  HENT:  Head: Normocephalic and atraumatic.  Eyes: Conjunctivae and EOM are normal. Pupils are equal, round, and reactive to light. No scleral icterus.  Neck: Normal range of motion. Neck supple. Carotid bruit is not present. No thyromegaly present.  Cardiovascular: Normal rate, regular rhythm and normal heart sounds.  Exam reveals no gallop and no friction rub.   No murmur heard. Pulmonary/Chest: Effort normal and breath sounds normal. No respiratory distress. She has no wheezes. She has no rales.  Abdominal: Soft. Bowel sounds are normal. She exhibits no distension, no abdominal bruit and no mass. There is no tenderness.  Musculoskeletal: She exhibits no edema.  Lymphadenopathy:     She has no cervical adenopathy.  Neurological: She is alert and oriented to person, place, and time.  Vitals reviewed.      Recent Results (from the past 2160 hour(s))  Comprehensive metabolic panel     Status: Abnormal   Collection Time: 03/19/16 12:27 PM  Result Value Ref Range   Sodium 136 135 - 145 mmol/L   Potassium 3.0 (L) 3.5 - 5.1 mmol/L   Chloride 102 101 - 111 mmol/L   CO2 25 22 - 32 mmol/L   Glucose, Bld 112 (H) 65 - 99 mg/dL   BUN 17 6 - 20 mg/dL   Creatinine, Ser 1.03 (H) 0.44 - 1.00 mg/dL   Calcium 8.7 (L) 8.9 - 10.3 mg/dL   Total Protein 7.7 6.5 - 8.1 g/dL   Albumin 4.2 3.5 - 5.0 g/dL   AST 739 (H) 15 - 41 U/L   ALT 405 (H) 14 - 54 U/L   Alkaline Phosphatase 114 38 - 126 U/L   Total Bilirubin 3.7 (H) 0.3 - 1.2 mg/dL   GFR calc non Af Amer >60 >60 mL/min   GFR calc Af Amer >60 >60 mL/min    Comment: (NOTE) The eGFR has been calculated using the CKD EPI equation. This calculation has not been validated in all clinical situations. eGFR's persistently <60 mL/min signify possible Chronic Kidney Disease.    Anion gap 9 5 - 15  Lactic acid, plasma     Status: None   Collection Time: 03/19/16 12:27 PM  Result Value Ref Range   Lactic Acid, Venous 1.0 0.5 - 1.9 mmol/L  CBC with Differential     Status: Abnormal   Collection Time: 03/19/16 12:27 PM  Result Value Ref Range   WBC 4.5 3.6 - 11.0 K/uL   RBC 4.98 3.80 - 5.20 MIL/uL   Hemoglobin 15.6 12.0 - 16.0 g/dL   HCT 45.0 35.0 - 47.0 %   MCV 90.3 80.0 - 100.0 fL   MCH 31.3 26.0 - 34.0 pg   MCHC 34.7 32.0 - 36.0 g/dL   RDW 13.3 11.5 - 14.5 %   Platelets 139 (L) 150 - 440 K/uL   Neutrophils Relative % 93 %   Neutro Abs 4.1 1.4 - 6.5 K/uL   Lymphocytes Relative 4 %   Lymphs Abs 0.2 (L) 1.0 - 3.6 K/uL   Monocytes Relative 3 %   Monocytes Absolute 0.1 (L) 0.2 - 0.9 K/uL   Eosinophils Relative 0 %   Eosinophils Absolute 0.0 0 - 0.7 K/uL   Basophils Relative 0 %   Basophils Absolute 0.0 0 - 0.1 K/uL   Culture, blood (Routine x 2)     Status: Abnormal   Collection Time: 03/19/16 12:27 PM  Result Value Ref Range   Specimen Description BLOOD RIGHT ARM  Special Requests BOTTLES DRAWN AEROBIC AND ANAEROBIC  3CC    Culture  Setup Time      GRAM POSITIVE COCCI AEROBIC BOTTLE ONLY CRITICAL RESULT CALLED TO, READ BACK BY AND VERIFIED WITH: SHEEMA HALLAJI 03/22/16 @ 13  [place]    Culture (A)     MICROCOCCUS LUTEUS/LYLAE THE SIGNIFICANCE OF ISOLATING THIS ORGANISM FROM A SINGLE SET OF BLOOD CULTURES WHEN MULTIPLE SETS ARE DRAWN IS UNCERTAIN. PLEASE NOTIFY THE MICROBIOLOGY DEPARTMENT WITHIN ONE WEEK IF SPECIATION AND SENSITIVITIES ARE REQUIRED. Performed at [facility]    Report Status 03/24/2016 FINAL   Urinalysis complete, with microscopic     Status: Abnormal   Collection Time: 03/19/16 12:27 PM  Result Value Ref Range   Color, Urine AMBER (A) YELLOW   APPearance CLEAR (A) CLEAR   Glucose, UA NEGATIVE NEGATIVE mg/dL   Bilirubin Urine 2+ (A) NEGATIVE   Ketones, ur NEGATIVE NEGATIVE mg/dL   Specific Gravity, Urine 1.031 (H) 1.005 - 1.030   Hgb urine dipstick NEGATIVE NEGATIVE   pH 6.0 5.0 - 8.0   Protein, ur >500 (A) NEGATIVE mg/dL   Nitrite NEGATIVE NEGATIVE   Leukocytes, UA NEGATIVE NEGATIVE   RBC / HPF 0-5 0 - 5 RBC/hpf   WBC, UA 0-5 0 - 5 WBC/hpf   Bacteria, UA RARE (A) NONE SEEN   Squamous Epithelial / LPF 6-30 (A) NONE SEEN   Mucous PRESENT   Urine culture     Status: None   Collection Time: 03/19/16 12:27 PM  Result Value Ref Range   Specimen Description URINE, RANDOM    Special Requests NONE    Culture NO GROWTH Performed at [facility]     Report Status 03/20/2016 FINAL   Lipase, blood     Status: None   Collection Time: 03/19/16 12:27 PM  Result Value Ref Range   Lipase 19 11 - 51 U/L  Hepatitis panel, acute     Status: Abnormal   Collection Time: 03/19/16 12:27 PM  Result Value Ref Range   Hepatitis B Surface Ag Negative Negative   HCV Ab  >11.0 (H) 0.0 - 0.9 s/co ratio    Comment: (NOTE)                                  Negative:     < 0.8                             Indeterminate: 0.8 - 0.9                                  Positive:     > 0.9 The CDC recommends that a positive HCV antibody result be followed up with a HCV Nucleic Acid Amplification test (329924). Performed At: [facility] [place], [place] [id number] Lindon Romp MD IW:[id number]    Hep A IgM Negative Negative   Hep B C IgM Negative Negative  Acetaminophen level     Status: Abnormal   Collection Time: 03/19/16 12:27 PM  Result Value Ref Range   Acetaminophen (Tylenol), Serum <10 (L) 10 - 30 ug/mL    Comment:        THERAPEUTIC CONCENTRATIONS VARY SIGNIFICANTLY. A RANGE OF 10-30 ug/mL MAY BE AN EFFECTIVE CONCENTRATION FOR MANY PATIENTS. HOWEVER, SOME ARE  BEST TREATED AT CONCENTRATIONS OUTSIDE THIS RANGE. ACETAMINOPHEN CONCENTRATIONS >150 ug/mL AT 4 HOURS AFTER INGESTION AND >50 ug/mL AT 12 HOURS AFTER INGESTION ARE OFTEN ASSOCIATED WITH TOXIC REACTIONS.   Urine Drug Screen, Qualitative ([place] only)     Status: Abnormal   Collection Time: 03/19/16 12:27 PM  Result Value Ref Range   Tricyclic, Ur Screen NONE DETECTED NONE DETECTED   Amphetamines, Ur Screen POSITIVE (A) NONE DETECTED   MDMA (Ecstasy)Ur Screen NONE DETECTED NONE DETECTED   Cocaine Metabolite,Ur [place] POSITIVE (A) NONE DETECTED   Opiate, Ur Screen NONE DETECTED NONE DETECTED   Phencyclidine (PCP) Ur S NONE DETECTED NONE DETECTED   Cannabinoid 50 Ng, Ur [place] POSITIVE (A) NONE DETECTED   Barbiturates, Ur Screen NONE DETECTED NONE DETECTED   Benzodiazepine, Ur Scrn POSITIVE (A) NONE DETECTED   Methadone Scn, Ur NONE DETECTED NONE DETECTED    Comment: (NOTE) 825  Tricyclics, urine               Cutoff 1000 ng/mL 200  Amphetamines, urine             Cutoff 1000 ng/mL 300  MDMA (Ecstasy), urine           Cutoff 500 ng/mL 400  Cocaine Metabolite, urine        Cutoff 300 ng/mL 500  Opiate, urine                   Cutoff 300 ng/mL 600  Phencyclidine (PCP), urine      Cutoff 25 ng/mL 700  Cannabinoid, urine              Cutoff 50 ng/mL 800  Barbiturates, urine             Cutoff 200 ng/mL 900  Benzodiazepine, urine           Cutoff 200 ng/mL 1000 Methadone, urine                Cutoff 300 ng/mL 1100 1200 The urine drug screen provides only a preliminary, unconfirmed 1300 analytical test result and should not be used for non-medical 1400 purposes. Clinical consideration and professional judgment should 1500 be applied to any positive drug screen result due to possible 1600 interfering substances. A more specific alternate chemical method 1700 must be used in order to obtain a confirmed analytical result.  1800 Gas chromato graphy / mass spectrometry (GC/MS) is the preferred 1900 confirmatory method.   Pregnancy, urine     Status: None   Collection Time: 03/19/16 12:27 PM  Result Value Ref Range   Preg Test, Ur NEGATIVE NEGATIVE  Ethanol     Status: None   Collection Time: 03/19/16 12:27 PM  Result Value Ref Range   Alcohol, Ethyl (B) <5 <5 mg/dL    Comment:        LOWEST DETECTABLE LIMIT FOR SERUM ALCOHOL IS 5 mg/dL FOR MEDICAL PURPOSES ONLY   Salicylate level     Status: None   Collection Time: 03/19/16 12:27 PM  Result Value Ref Range   Salicylate Lvl <0.5 2.8 - 30.0 mg/dL  Blood Culture ID Panel (Reflexed)     Status: None   Collection Time: 03/19/16 12:27 PM  Result Value Ref Range   Enterococcus species NOT DETECTED NOT DETECTED   Vancomycin resistance NOT DETECTED NOT DETECTED   Listeria monocytogenes NOT DETECTED NOT DETECTED   Staphylococcus species NOT DETECTED NOT DETECTED   Staphylococcus aureus NOT DETECTED NOT DETECTED   Methicillin resistance  NOT DETECTED NOT DETECTED   Streptococcus species NOT DETECTED NOT DETECTED   Streptococcus agalactiae NOT DETECTED NOT DETECTED   Streptococcus pneumoniae NOT DETECTED NOT  DETECTED   Streptococcus pyogenes NOT DETECTED NOT DETECTED   Acinetobacter baumannii NOT DETECTED NOT DETECTED   Enterobacteriaceae species NOT DETECTED NOT DETECTED   Enterobacter cloacae complex NOT DETECTED NOT DETECTED   Escherichia coli NOT DETECTED NOT DETECTED   Klebsiella oxytoca NOT DETECTED NOT DETECTED   Klebsiella pneumoniae NOT DETECTED NOT DETECTED   Proteus species NOT DETECTED NOT DETECTED   Serratia marcescens NOT DETECTED NOT DETECTED   Carbapenem resistance NOT DETECTED NOT DETECTED   Haemophilus influenzae NOT DETECTED NOT DETECTED   Neisseria meningitidis NOT DETECTED NOT DETECTED   Pseudomonas aeruginosa NOT DETECTED NOT DETECTED   Candida albicans NOT DETECTED NOT DETECTED   Candida glabrata NOT DETECTED NOT DETECTED   Candida krusei NOT DETECTED NOT DETECTED   Candida parapsilosis NOT DETECTED NOT DETECTED   Candida tropicalis NOT DETECTED NOT DETECTED  Lactic acid, plasma     Status: None   Collection Time: 03/19/16  4:19 PM  Result Value Ref Range   Lactic Acid, Venous 1.1 0.5 - 1.9 mmol/L  Hepatitis A antibody, IgM     Status: None   Collection Time: 03/19/16  6:35 PM  Result Value Ref Range   Hep A IgM Negative Negative    Comment: (NOTE) Performed At: [facility] [place], [place] [id number] Lindon Romp MD XF:[id number]   HCV RNA quant     Status: None   Collection Time: 03/19/16  6:35 PM  Result Value Ref Range   HCV Quantitative 2,710 >50 IU/mL   HCV Quantitative Log 3.433 >1.70 log10 IU/mL   Test Information Comment     Comment: (NOTE) The quantitative range of this assay is 15 IU/mL to 100 million IU/mL. Performed At: [facility] [place], [place] [id number] Lindon Romp MD FB:[id number]   Hepatitis B surface antigen     Status: None   Collection Time: 03/19/16  6:35 PM  Result Value Ref Range   Hepatitis B Surface Ag Negative Negative    Comment: (NOTE) Performed At: [facility] [place], [place] [id number] Lindon Romp MD [id number]   Ethanol     Status: None   Collection Time: 03/19/16  6:35 PM  Result Value Ref Range   Alcohol, Ethyl (B) <5 <5 mg/dL    Comment:        LOWEST DETECTABLE LIMIT FOR SERUM ALCOHOL IS 5 mg/dL FOR MEDICAL PURPOSES ONLY   Comprehensive metabolic panel     Status: Abnormal   Collection Time: 03/20/16  4:08 AM  Result Value Ref Range   Sodium 132 (L) 135 - 145 mmol/L   Potassium 4.3 3.5 - 5.1 mmol/L   Chloride 101 101 - 111 mmol/L   CO2 26 22 - 32 mmol/L   Glucose, Bld 93 65 - 99 mg/dL   BUN 13 6 - 20 mg/dL   Creatinine, Ser 0.77 0.44 - 1.00 mg/dL   Calcium 7.5 (L) 8.9 - 10.3 mg/dL   Total Protein 6.0 (L) 6.5 - 8.1 g/dL   Albumin 3.1 (L) 3.5 - 5.0 g/dL   AST 233 (H) 15 - 41 U/L   ALT 231 (H) 14 - 54 U/L   Alkaline Phosphatase 88 38 - 126 U/L   Total Bilirubin 2.7 (H) 0.3 - 1.2 mg/dL   GFR calc  non Af Amer >60 >60 mL/min   GFR calc Af Amer >60 >60 mL/min    Comment: (NOTE) The eGFR has been calculated using the CKD EPI equation. This calculation has not been validated in all clinical situations. eGFR's persistently <60 mL/min signify possible Chronic Kidney Disease.    Anion gap 5 5 - 15  CBC     Status: Abnormal   Collection Time: 03/20/16  4:08 AM  Result Value Ref Range   WBC 3.3 (L) 3.6 - 11.0 K/uL   RBC 4.20 3.80 - 5.20 MIL/uL   Hemoglobin 13.1 12.0 - 16.0 g/dL   HCT 38.0 35.0 - 47.0 %   MCV 90.5 80.0 - 100.0 fL   MCH 31.1 26.0 - 34.0 pg   MCHC 34.4 32.0 - 36.0 g/dL   RDW 12.9 11.5 - 14.5 %   Platelets 94 (L) 150 - 440 K/uL  Amylase     Status: Abnormal   Collection Time: 03/20/16  4:08 AM  Result Value Ref Range   Amylase 25 (L) 28 - 100 U/L  Lipase, blood     Status: None   Collection Time: 03/20/16  4:08 AM  Result Value Ref Range   Lipase 13 11 - 51 U/L  Rapid HIV screen (HIV 1/2 Ab+Ag)     Status: None   Collection Time: 03/20/16  7:51 AM  Result Value Ref Range    HIV-1 P24 Antigen - HIV24 NON REACTIVE NON REACTIVE   HIV 1/2 Antibodies NON REACTIVE NON REACTIVE   Interpretation (HIV Ag Ab)      A non reactive test result means that HIV 1 or HIV 2 antibodies and HIV 1 p24 antigen were not detected in the specimen.  Protime-INR     Status: Abnormal   Collection Time: 03/20/16  7:51 AM  Result Value Ref Range   Prothrombin Time 16.1 (H) 11.4 - 15.2 seconds   INR 1.28   Ammonia     Status: None   Collection Time: 03/20/16  7:51 AM  Result Value Ref Range   Ammonia 14 9 - 35 umol/L  Hepatic function panel     Status: Abnormal   Collection Time: 03/20/16  7:51 AM  Result Value Ref Range   Total Protein 6.1 (L) 6.5 - 8.1 g/dL   Albumin 3.2 (L) 3.5 - 5.0 g/dL   AST 216 (H) 15 - 41 U/L   ALT 225 (H) 14 - 54 U/L   Alkaline Phosphatase 92 38 - 126 U/L   Total Bilirubin 2.3 (H) 0.3 - 1.2 mg/dL   Bilirubin, Direct 1.4 (H) 0.1 - 0.5 mg/dL   Indirect Bilirubin 0.9 0.3 - 0.9 mg/dL  Culture, group A strep     Status: None   Collection Time: 03/20/16  9:29 AM  Result Value Ref Range   Specimen Description THROAT    Special Requests NONE    Culture      NO GROUP A STREP (S.PYOGENES) ISOLATED Performed at [facility]    Report Status 03/22/2016 FINAL   Lipase, blood     Status: None   Collection Time: 03/21/16  9:35 AM  Result Value Ref Range   Lipase 19 11 - 51 U/L  Comprehensive metabolic panel     Status: Abnormal   Collection Time: 03/21/16  9:35 AM  Result Value Ref Range   Sodium 137 135 - 145 mmol/L   Potassium 3.2 (L) 3.5 - 5.1 mmol/L   Chloride 102 101 - 111 mmol/L  CO2 27 22 - 32 mmol/L   Glucose, Bld 99 65 - 99 mg/dL   BUN 12 6 - 20 mg/dL   Creatinine, Ser 0.64 0.44 - 1.00 mg/dL   Calcium 8.8 (L) 8.9 - 10.3 mg/dL   Total Protein 7.0 6.5 - 8.1 g/dL   Albumin 3.7 3.5 - 5.0 g/dL   AST 139 (H) 15 - 41 U/L   ALT 192 (H) 14 - 54 U/L   Alkaline Phosphatase 203 (H) 38 - 126 U/L   Total Bilirubin 3.9 (H) 0.3 - 1.2 mg/dL   GFR calc  non Af Amer >60 >60 mL/min   GFR calc Af Amer >60 >60 mL/min    Comment: (NOTE) The eGFR has been calculated using the CKD EPI equation. This calculation has not been validated in all clinical situations. eGFR's persistently <60 mL/min signify possible Chronic Kidney Disease.    Anion gap 8 5 - 15  CBC     Status: Abnormal   Collection Time: 03/21/16  9:35 AM  Result Value Ref Range   WBC 4.0 3.6 - 11.0 K/uL   RBC 4.33 3.80 - 5.20 MIL/uL   Hemoglobin 13.5 12.0 - 16.0 g/dL   HCT 38.4 35.0 - 47.0 %   MCV 88.7 80.0 - 100.0 fL   MCH 31.1 26.0 - 34.0 pg   MCHC 35.0 32.0 - 36.0 g/dL   RDW 13.1 11.5 - 14.5 %   Platelets 130 (L) 150 - 440 K/uL     Assessment & Plan  Problem List Items Addressed This Visit      Digestive   Acute hepatitis - Primary   Hepatitis C   Relevant Orders   COMPLETE METABOLIC PANEL WITH GFR   Ambulatory referral to Gastroenterology     Other   Leukopenia   Relevant Orders   CBC with Differential   Thrombocytopenia (HCC)   Relevant Orders   CBC with Differential   IV drug user    Other Visit Diagnoses    Asthma, mild intermittent, uncomplicated       Relevant Medications   albuterol (PROVENTIL HFA;VENTOLIN HFA) 108 (90 Base) MCG/ACT inhaler   Other Relevant Orders   TSH      Meds ordered this encounter  Medications  . DISCONTD: albuterol (PROVENTIL HFA;VENTOLIN HFA) 108 (90 Base) MCG/ACT inhaler    Sig: Inhale 1 puff into the lungs every 6 (six) hours as needed.  Marland Kitchen albuterol (PROVENTIL HFA;VENTOLIN HFA) 108 (90 Base) MCG/ACT inhaler    Sig: Inhale 2 puffs into the lungs every 6 (six) hours as needed for wheezing or shortness of breath.    Dispense:  1 Inhaler    Refill:  12   1. Acute hepatitis   2. Acute hepatitis C virus infection without hepatic coma  - COMPLETE METABOLIC PANEL WITH GFR - Ambulatory referral to Gastroenterology- Dr.  Jamal Maes 3. IV drug user Off x 2 weeks  4. Leukopenia  - CBC with Differential  5.  Thrombocytopenia (HCC)  - CBC with Differential  6. Asthma, mild intermittent, uncomplicated  - TSH - albuterol (PROVENTIL HFA;VENTOLIN HFA) 108 (90 Base) MCG/ACT inhaler; Inhale 2 puffs into the lungs every 6 (six) hours as needed for wheezing or shortness of breath.  Dispense: 1 Inhaler; Refill: 12

## 2016-04-06 LAB — CBC WITH DIFFERENTIAL/PLATELET
BASOS PCT: 1 %
Basophils Absolute: 56 cells/uL (ref 0–200)
EOS PCT: 2 %
Eosinophils Absolute: 112 cells/uL (ref 15–500)
HEMATOCRIT: 42.2 % (ref 35.0–45.0)
HEMOGLOBIN: 13.6 g/dL (ref 11.7–15.5)
LYMPHS ABS: 2296 {cells}/uL (ref 850–3900)
Lymphocytes Relative: 41 %
MCH: 29.5 pg (ref 27.0–33.0)
MCHC: 32.2 g/dL (ref 32.0–36.0)
MCV: 91.5 fL (ref 80.0–100.0)
MONOS PCT: 7 %
MPV: 10.7 fL (ref 7.5–12.5)
Monocytes Absolute: 392 cells/uL (ref 200–950)
NEUTROS ABS: 2744 {cells}/uL (ref 1500–7800)
Neutrophils Relative %: 49 %
PLATELETS: 369 10*3/uL (ref 140–400)
RBC: 4.61 MIL/uL (ref 3.80–5.10)
RDW: 13.7 % (ref 11.0–15.0)
WBC: 5.6 10*3/uL (ref 3.8–10.8)

## 2016-04-06 LAB — COMPLETE METABOLIC PANEL WITH GFR
ALBUMIN: 4.1 g/dL (ref 3.6–5.1)
ALK PHOS: 115 U/L (ref 33–115)
ALT: 22 U/L (ref 6–29)
AST: 26 U/L (ref 10–30)
BILIRUBIN TOTAL: 1 mg/dL (ref 0.2–1.2)
BUN: 8 mg/dL (ref 7–25)
CO2: 25 mmol/L (ref 20–31)
Calcium: 9.6 mg/dL (ref 8.6–10.2)
Chloride: 105 mmol/L (ref 98–110)
Creat: 0.73 mg/dL (ref 0.50–1.10)
GLUCOSE: 89 mg/dL (ref 65–99)
POTASSIUM: 4.1 mmol/L (ref 3.5–5.3)
SODIUM: 140 mmol/L (ref 135–146)
TOTAL PROTEIN: 7.1 g/dL (ref 6.1–8.1)

## 2016-04-06 LAB — TSH: TSH: 0.7 mIU/L

## 2016-04-07 LAB — CULTURE, BLOOD (ROUTINE X 2): Culture: NO GROWTH

## 2016-05-24 ENCOUNTER — Ambulatory Visit (INDEPENDENT_AMBULATORY_CARE_PROVIDER_SITE_OTHER): Payer: Medicaid Other | Admitting: Gastroenterology

## 2016-05-24 ENCOUNTER — Ambulatory Visit: Payer: Medicaid Other | Admitting: Gastroenterology

## 2016-05-24 ENCOUNTER — Encounter: Payer: Self-pay | Admitting: Gastroenterology

## 2016-05-24 ENCOUNTER — Other Ambulatory Visit: Payer: Self-pay

## 2016-05-24 VITALS — BP 113/69 | HR 80 | Temp 97.7°F | Ht 59.0 in | Wt 132.0 lb

## 2016-05-24 DIAGNOSIS — B1920 Unspecified viral hepatitis C without hepatic coma: Secondary | ICD-10-CM

## 2016-05-24 NOTE — Progress Notes (Signed)
   Primary Care Physician: Fidel LevyJames Hawkins Jr, MD  Primary Gastroenterologist:  Dr. Midge Miniumarren Alanys Godino  Chief Complaint  Patient presents with  . Hepatitis C    HPI: Kathleen BassetCarmen L Tate is a 30 y.o. female here for follow-up after being in the hospital for acute hepatitis. The patient reports that she shared IV drugs with a friend of hers and within 24 hours she felt very sick. The patient was admitted with high liver enzymes and a hepatitis C antibody that was positive. The patient states that she has stopped doing all drugs except one night when her boyfriend got out of gel and casual marijuana use. She has not used any IV drugs since then. She states she is feeling much better without any complaints at the present time.  Current Outpatient Prescriptions  Medication Sig Dispense Refill  . albuterol (PROVENTIL HFA;VENTOLIN HFA) 108 (90 Base) MCG/ACT inhaler Inhale 2 puffs into the lungs every 6 (six) hours as needed for wheezing or shortness of breath. 1 Inhaler 12  . ondansetron (ZOFRAN ODT) 4 MG disintegrating tablet Take 1 tablet (4 mg total) by mouth every 8 (eight) hours as needed for nausea or vomiting. 20 tablet 0  . citalopram (CELEXA) 20 MG tablet Take 20 mg by mouth daily.     No current facility-administered medications for this visit.     Allergies as of 05/24/2016 - Review Complete 05/24/2016  Allergen Reaction Noted  . Cyclobenzaprine Nausea And Vomiting 03/19/2016    ROS:  General: Negative for anorexia, weight loss, fever, chills, fatigue, weakness. ENT: Negative for hoarseness, difficulty swallowing , nasal congestion. CV: Negative for chest pain, angina, palpitations, dyspnea on exertion, peripheral edema.  Respiratory: Negative for dyspnea at rest, dyspnea on exertion, cough, sputum, wheezing.  GI: See history of present illness. GU:  Negative for dysuria, hematuria, urinary incontinence, urinary frequency, nocturnal urination.  Endo: Negative for unusual weight change.      Physical Examination:   BP 113/69   Pulse 80   Temp 97.7 F (36.5 C) (Oral)   Ht 4\' 11"  (1.499 m)   Wt 132 lb (59.9 kg)   BMI 26.66 kg/m   General: Well-nourished, well-developed in no acute distress.  Eyes: No icterus. Conjunctivae pink. Mouth: Oropharyngeal mucosa moist and pink , no lesions erythema or exudate. Lungs: Clear to auscultation bilaterally. Non-labored. Heart: Regular rate and rhythm, no murmurs rubs or gallops.  Abdomen: Bowel sounds are normal, nontender, nondistended, no hepatosplenomegaly or masses, no abdominal bruits or hernia , no rebound or guarding.   Extremities: No lower extremity edema. No clubbing or deformities. Neuro: Alert and oriented x 3.  Grossly intact. Skin: Warm and dry, no jaundice.   Psych: Alert and cooperative, normal mood and affect.  Labs:    Imaging Studies: No results found.  Assessment and Plan:   Kathleen BassetCarmen L Tate is a 30 y.o. y/o female who was discharged from the hospital with a diagnosis of acute hepatitis. The patient appeared to have acute hepatitis C. The patient has had her liver enzymes normalized. The patient will have her hepatitis C viral load checked again. If it is positive the patient has been told to follow-up in 4-6 months to document chronicity and thereby the need for treatment otherwise if it is negative the patient will follow up as needed.   Note: This dictation was prepared with Dragon dictation along with smaller phrase technology. Any transcriptional errors that result from this process are unintentional.

## 2016-06-03 LAB — HCV RT-PCR, QUANT (NON-GRAPH)
HCV QUANT LOG: 2.672 {Log_copies}/mL
HCV QUANTITATIVE BASELINE: 190 [IU]/mL
HCV Quantitative: 470 Copies/mL
IU LOG10: 2.279 {Log_IU}/mL

## 2016-06-09 ENCOUNTER — Telehealth: Payer: Self-pay

## 2016-06-09 NOTE — Telephone Encounter (Signed)
-----   Message from Midge Miniumarren Wohl, MD sent at 06/03/2016  1:52 PM EDT ----- Let the patient know that her hepatitis C level is still positive. We will wait for a total of 6 months after she was diagnosed prior to considering treatment. The patient will also have to continue to avoid drug abuse.

## 2016-06-09 NOTE — Telephone Encounter (Signed)
Tried contacting pt but vm box was not set up yet.

## 2016-06-11 ENCOUNTER — Other Ambulatory Visit: Payer: Self-pay

## 2016-06-11 NOTE — Telephone Encounter (Signed)
Mailed pt a letter requesting a return call for results.

## 2016-06-11 NOTE — Telephone Encounter (Signed)
Tried contacting pt again but vm was not set up.

## 2017-04-18 ENCOUNTER — Emergency Department
Admission: EM | Admit: 2017-04-18 | Discharge: 2017-04-18 | Disposition: A | Discharge status: Home / Self Care | Payer: Medicaid Other | Attending: Emergency Medicine | Admitting: Emergency Medicine

## 2017-04-18 ENCOUNTER — Encounter: Payer: Self-pay | Admitting: Emergency Medicine

## 2017-04-18 DIAGNOSIS — B86 Scabies: Secondary | ICD-10-CM

## 2017-04-18 DIAGNOSIS — Z79899 Other long term (current) drug therapy: Secondary | ICD-10-CM | POA: Insufficient documentation

## 2017-04-18 DIAGNOSIS — F17210 Nicotine dependence, cigarettes, uncomplicated: Secondary | ICD-10-CM | POA: Insufficient documentation

## 2017-04-18 DIAGNOSIS — R21 Rash and other nonspecific skin eruption: Secondary | ICD-10-CM | POA: Diagnosis present

## 2017-04-18 MED ORDER — IVERMECTIN 3 MG PO TABS: 200.0000 ug/kg | ORAL_TABLET | Freq: Every day | ORAL | 0 refills | Status: AC

## 2017-04-18 NOTE — ED Triage Notes (Signed)
Presents with rash  States rash about 2 weeks   Has been treated for scabies but rash conts

## 2017-04-18 NOTE — Discharge Instructions (Signed)
Take medication as prescribed. Return to emergency department if symptoms worsen and follow-up with PCP as needed.    Ivermectin: Take Dose 1 Today; Take Dose 2 on 04/24/1017.

## 2017-04-18 NOTE — ED Provider Notes (Signed)
[facility] Emergency Department Provider Note   ____________________________________________   I have reviewed the triage vital signs and the nursing notes.   HISTORY  Chief Complaint Rash    HPI Kathleen Tate is a 31 y.o. female presents to the emergency department with scabies bite wounds along bilateral upper and lower extremity and hands that has persisted for approximately 2 weeks. Patient reports completing a course of permethrin cream for scabies without any improvement of symptoms. Other family members have responded to treatments scabies Patient reports she has taken all measures to rid the house of scabies at this time. Patient reports pain, itching of all the wound areas. Patient denies fever, chills, headache, vision changes, chest pain, chest tightness, shortness of breath, abdominal pain, nausea and vomiting. Patient denies fever, chills, headache, vision changes, chest pain, chest tightness, shortness of breath, abdominal pain, nausea and vomiting.  Past Medical History:  Diagnosis Date  . Depression   . Drug abuse   . Hepatitis C     Patient Active Problem List   Diagnosis Date Noted  . Hepatitis C 04/05/2016  . IV drug user 04/05/2016  . Hypotension 03/21/2016  . Hyponatremia 03/21/2016  . Acute renal insufficiency 03/21/2016  . Leukopenia 03/21/2016  . Thrombocytopenia (HCC) 03/21/2016  . Acute hepatitis 03/19/2016    History reviewed. No pertinent surgical history.  Prior to Admission medications   Medication Sig Start Date End Date Taking? Authorizing Provider  albuterol (PROVENTIL HFA;VENTOLIN HFA) 108 (90 Base) MCG/ACT inhaler Inhale 2 puffs into the lungs every 6 (six) hours as needed for wheezing or shortness of breath. 04/05/16   Janeann Forehand., MD  citalopram (CELEXA) 20 MG tablet Take 20 mg by mouth daily.    [provider]  ivermectin (STROMECTOL) 3 MG TABS tablet Take 3.5 tablets (10,500 mcg total) by  mouth daily. Dose 1 on 04/18/2017; Dose 2 on 04/25/2017 04/18/17 04/25/17  Lyan Moyano M, PA-C  ondansetron (ZOFRAN ODT) 4 MG disintegrating tablet Take 1 tablet (4 mg total) by mouth every 8 (eight) hours as needed for nausea or vomiting. 03/21/16   Minna Antis, MD    Allergies Cyclobenzaprine  Family History  Problem Relation Age of Onset  . Lung cancer Mother   . Lung cancer Father     Social History Social History  Substance Use Topics  . Smoking status: Current Every Day Smoker    Packs/day: 0.50    Types: Cigarettes  . Smokeless tobacco: Never Used  . Alcohol use 1.2 oz/week    2 Standard drinks or equivalent per week     Comment: weekends at times    Review of Systems Constitutional: Negative for fever/chills Eyes: No visual changes. ENT:  Negative for sore throat and for difficulty swallowing Cardiovascular: Denies chest pain. Respiratory: Denies cough. Denies shortness of breath. Gastrointestinal: No abdominal pain.  No nausea, vomiting, diarrhea. Genitourinary: Negative for dysuria. Musculoskeletal: Negative for back pain.  Skin:Positive for scabies rash. Neurological: Negative for headaches.  Negative focal weakness or numbness. Negative for loss of consciousness. Able to ambulate. ____________________________________________   PHYSICAL EXAM:  VITAL SIGNS: ED Triage Vitals  Enc Vitals Group     BP 04/18/17 1227 115/76     Pulse Rate 04/18/17 1227 78     Resp 04/18/17 1227 18     Temp 04/18/17 1227 98.1 F (36.7 C)     Temp Source 04/18/17 1227 Oral     SpO2 04/18/17 1227 98 %  Weight 04/18/17 1228 120 lb (54.4 kg)     Height 04/18/17 1228 4\' 11"  (1.499 m)     Head Circumference --      Peak Flow --      Pain Score --      Pain Loc --      Pain Edu? --      Excl. in GC? --     Constitutional: Alert and oriented. Well appearing and in no acute distress.  Eyes: Conjunctivae are normal. PERRL. EOMI  Head: Normocephalic and atraumatic. ENT:       Ears: Canals clear. TMs intact bilaterally.      Nose: No congestion/rhinnorhea.      Mouth/Throat: Mucous membranes are moist.  Neck:Supple. No thyromegaly. No stridor.  Cardiovascular: Normal rate, regular rhythm. Normal S1 and S2.  Good peripheral circulation. Respiratory: Normal respiratory effort without tachypnea or retractions. Lungs CTAB. No wheezes/rales/rhonchi. Good air entry to the bases with no decreased or absent breath sounds. Hematological/Lymphatic/Immunological: No cervical lymphadenopathy. Cardiovascular: Normal rate, regular rhythm. Normal distal pulses. Gastrointestinal: Bowel sounds 4 quadrants. Soft and nontender to palpation. No guarding or rigidity. No palpable masses. No distention. No CVA tenderness. Musculoskeletal: Nontender with normal range of motion in all extremities. Neurologic: Normal speech and language.   Skin:  Skin is warm, dry and intact. Scabies lesions along bilateral upper and lower extremity and hands.  Multiple areas of excoriation.  Psychiatric: Mood and affect are normal. Speech and behavior are normal. Patient exhibits appropriate insight and judgement.   ____________________________________________   LABS (all labs ordered are listed, but only abnormal results are displayed)  Labs Reviewed - No data to display ____________________________________________  EKG no ____________________________________________  RADIOLOGY No ____________________________________________   PROCEDURES  Procedure(s) performed: no    Critical Care performed: no ____________________________________________   INITIAL IMPRESSION / ASSESSMENT AND PLAN / ED COURSE  Pertinent labs & imaging results that were available during my care of the patient were reviewed by me and considered in my medical decision making (see chart for details).    Patient presents to emergency department with scabies rash. History and  physical exam findings are  consistent with scabies rash. Patient will be prescribed ivermectin Patient advised to follow up with PCP as needed or return to the emergency department if symptoms return or worsen. Patient informed of clinical course, understand medical decision-making process, and agree with plan.   ____________________________________________   FINAL CLINICAL IMPRESSION(S) / ED DIAGNOSES  Final diagnoses:  Scabies       NEW MEDICATIONS STARTED DURING THIS VISIT:  Discharge Medication List as of 04/18/2017 12:44 PM    START taking these medications   Details  ivermectin (STROMECTOL) 3 MG TABS tablet Take 3.5 tablets (10,500 mcg total) by mouth daily. Dose 1 on 04/18/2017; Dose 2 on 04/25/2017, Starting Mon 04/18/2017, Until Mon 04/25/2017, Print         Note:  This document was prepared using Dragon voice recognition software and may include unintentional dictation errors.    Clois Comber, PA-C 04/18/17 1717    Jene Every, MD 04/19/17 [phone]

## 2017-10-22 ENCOUNTER — Emergency Department
Admission: EM | Admit: 2017-10-22 | Discharge: 2017-10-22 | Disposition: A | Discharge status: Home / Self Care | Payer: Medicaid Other | Attending: Emergency Medicine | Admitting: Emergency Medicine

## 2017-10-22 ENCOUNTER — Encounter: Payer: Self-pay | Admitting: Emergency Medicine

## 2017-10-22 ENCOUNTER — Emergency Department: Payer: Medicaid Other

## 2017-10-22 DIAGNOSIS — S61012A Laceration without foreign body of left thumb without damage to nail, initial encounter: Secondary | ICD-10-CM | POA: Diagnosis present

## 2017-10-22 DIAGNOSIS — Y93E5 Activity, floor mopping and cleaning: Secondary | ICD-10-CM | POA: Diagnosis not present

## 2017-10-22 DIAGNOSIS — Y929 Unspecified place or not applicable: Secondary | ICD-10-CM | POA: Insufficient documentation

## 2017-10-22 DIAGNOSIS — F17210 Nicotine dependence, cigarettes, uncomplicated: Secondary | ICD-10-CM | POA: Insufficient documentation

## 2017-10-22 DIAGNOSIS — W25XXXA Contact with sharp glass, initial encounter: Secondary | ICD-10-CM | POA: Diagnosis not present

## 2017-10-22 DIAGNOSIS — S61512A Laceration without foreign body of left wrist, initial encounter: Secondary | ICD-10-CM | POA: Diagnosis not present

## 2017-10-22 DIAGNOSIS — Y998 Other external cause status: Secondary | ICD-10-CM | POA: Diagnosis not present

## 2017-10-22 DIAGNOSIS — Z79899 Other long term (current) drug therapy: Secondary | ICD-10-CM | POA: Insufficient documentation

## 2017-10-22 MED ORDER — BACITRACIN ZINC 500 UNIT/GM EX OINT: TOPICAL_OINTMENT | Freq: Once | CUTANEOUS | Status: DC

## 2017-10-22 MED ORDER — BACITRACIN ZINC 500 UNIT/GM EX OINT
TOPICAL_OINTMENT | CUTANEOUS | Status: AC
  Filled 2017-10-22: qty 0.9

## 2017-10-22 MED ORDER — LIDOCAINE HCL (PF) 1 % IJ SOLN
INTRAMUSCULAR | Status: AC
  Filled 2017-10-22: qty 5

## 2017-10-22 NOTE — ED Notes (Signed)
Left hand wounds cleansed with saline and wound cleanser, bactitracin applied followed by sterile non stick dressing and kerlix.

## 2017-10-22 NOTE — ED Provider Notes (Signed)
[facility] Emergency Department Provider Note   ____________________________________________   First MD Initiated Contact with Patient 10/22/17 0421     (approximate)  I have reviewed the triage vital signs and the nursing notes.   HISTORY  Chief Complaint Laceration    HPI Kathleen Tate is a 32 y.o. female who presents to the ED from home with a chief complaint of laceration.  Patient states she was washing a glass when it broke and cut her left wrist and thumb.  Tetanus is up-to-date.  Denies extremity weakness, numbness or tingling voices no other complaints or injuries.   Past Medical History:  Diagnosis Date  . Depression   . Drug abuse (HCC)   . Hepatitis C     Patient Active Problem List   Diagnosis Date Noted  . Hepatitis C 04/05/2016  . IV drug user 04/05/2016  . Hypotension 03/21/2016  . Hyponatremia 03/21/2016  . Acute renal insufficiency 03/21/2016  . Leukopenia 03/21/2016  . Thrombocytopenia (HCC) 03/21/2016  . Acute hepatitis 03/19/2016    History reviewed. No pertinent surgical history.  Prior to Admission medications   Medication Sig Start Date End Date Taking? Authorizing Provider  albuterol (PROVENTIL HFA;VENTOLIN HFA) 108 (90 Base) MCG/ACT inhaler Inhale 2 puffs into the lungs every 6 (six) hours as needed for wheezing or shortness of breath. 04/05/16   Janeann Forehand., MD  citalopram (CELEXA) 20 MG tablet Take 20 mg by mouth daily.    [provider]  ondansetron (ZOFRAN ODT) 4 MG disintegrating tablet Take 1 tablet (4 mg total) by mouth every 8 (eight) hours as needed for nausea or vomiting. 03/21/16   Minna Antis, MD    Allergies Cyclobenzaprine  Family History  Problem Relation Age of Onset  . Lung cancer Mother   . Lung cancer Father     Social History Social History   Tobacco Use  . Smoking status: Current Every Day Smoker    Packs/day: 0.50    Types: Cigarettes  . Smokeless  tobacco: Never Used  Substance Use Topics  . Alcohol use: No    Frequency: Never  . Drug use: Yes    Types: Marijuana, Amphetamines, MDMA (Ecstacy)    Review of Systems  Constitutional: No fever/chills. Eyes: No visual changes. ENT: No sore throat. Cardiovascular: Denies chest pain. Respiratory: Denies shortness of breath. Gastrointestinal: No abdominal pain.  No nausea, no vomiting.  No diarrhea.  No constipation. Genitourinary: Negative for dysuria. Musculoskeletal: Positive for left wrist and thumb laceration.  Negative for back pain. Skin: Negative for rash. Neurological: Negative for headaches, focal weakness or numbness.   ____________________________________________   PHYSICAL EXAM:  VITAL SIGNS: ED Triage Vitals [10/22/17 0345]  Enc Vitals Group     BP 116/61     Pulse Rate 83     Resp 18     Temp 98.3 F (36.8 C)     Temp Source Oral     SpO2 98 %     Weight 125 lb (56.7 kg)     Height 4\' 11"  (1.499 m)     Head Circumference      Peak Flow      Pain Score 8     Pain Loc      Pain Edu?      Excl. in GC?     Constitutional: Alert and oriented. Well appearing and in no acute distress. Eyes: Conjunctivae are normal. PERRL. EOMI. Head: Atraumatic. Nose: No congestion/rhinnorhea. Mouth/Throat: Mucous  membranes are moist.  Oropharynx non-erythematous. Neck: No stridor.  No cervical spine tenderness to palpation. Cardiovascular: Normal rate, regular rhythm. Grossly normal heart sounds.  Good peripheral circulation. Respiratory: Normal respiratory effort.  No retractions. Lungs CTAB. Gastrointestinal: Soft and nontender. No distention. No abdominal bruits. No CVA tenderness. Musculoskeletal:  LUE: Avulsion skin tear to lateral dorsal wrist without active bleeding.  Less than 1 cm linear abrasion type laceration to finger pad of left thumb.  2+ radial pulse.  Brisk, less than 5-second capillary refill. Neurologic:  Normal speech and language. No gross focal  neurologic deficits are appreciated. No gait instability. Skin:  Skin is warm, dry and intact. No rash noted. Psychiatric: Mood and affect are normal. Speech and behavior are normal.  ____________________________________________   LABS (all labs ordered are listed, but only abnormal results are displayed)  Labs Reviewed - No data to display ____________________________________________  EKG  None ____________________________________________  RADIOLOGY  ED MD interpretation: None  Official radiology report(s): Dg Hand Complete Left  Result Date: 10/22/2017 CLINICAL DATA:  Laceration by glass, broken glass in sink. Evaluate for foreign body. EXAM: LEFT HAND - COMPLETE 3+ VIEW COMPARISON:  None. FINDINGS: There is no evidence of fracture or dislocation. There is no evidence of arthropathy or other focal bone abnormality. No radiopaque foreign body. No tracking soft tissue air. Soft tissue laceration suspected about the ulnar aspect of the wrist. IMPRESSION: No radiopaque foreign body or acute osseous abnormality. Electronically Signed   By: Rubye OaksMelanie  Ehinger M.D.   On: 10/22/2017 05:12    ____________________________________________   PROCEDURES  Procedure(s) performed: None  Procedures  Critical Care performed: No  ____________________________________________   INITIAL IMPRESSION / ASSESSMENT AND PLAN / ED COURSE  As part of my medical decision making, I reviewed the following data within the electronic MEDICAL RECORD NUMBER Nursing notes reviewed and incorporated and Notes from prior ED visits   32 year old female who presents with minor superficial lacerations.  Will image thumb to evaluate for foreign body.  Wounds do not require suture repair.  Will cleanse and dress wounds.  Clinical Course as of Oct 23 538  Sat Oct 22, 2017  [id number] Updated patient of negative imaging results.  Nurse to cleanse wounds and provide wound care.  No sutures required.  Strict return  precautions given.  Patient verbalizes understanding and agrees with plan of care.  [JS]    Clinical Course User Index [JS] Irean HongSung, Dlisa Barnwell J, MD     ____________________________________________   FINAL CLINICAL IMPRESSION(S) / ED DIAGNOSES  Final diagnoses:  Laceration of left thumb without foreign body without damage to nail, initial encounter  Tear of skin of left wrist, initial encounter     ED Discharge Orders    None       Note:  This document was prepared using Dragon voice recognition software and may include unintentional dictation errors.    Irean HongSung, Laniyah Rosenwald J, MD 10/22/17 [phone]

## 2017-10-22 NOTE — ED Triage Notes (Signed)
Patient states that she was washing dishes and there was broken glass in the sink. Patient with laceration to top of right wrist and right thumb, bleeding controlled.

## 2017-10-22 NOTE — Discharge Instructions (Signed)
Keep wounds clean and dry.  Return to the ER for worsening symptoms, increased redness/swelling, purulent discharge or other concerns. °

## 2018-07-30 DIAGNOSIS — R109 Unspecified abdominal pain: Secondary | ICD-10-CM | POA: Diagnosis not present

## 2018-07-30 DIAGNOSIS — N10 Acute tubulo-interstitial nephritis: Secondary | ICD-10-CM | POA: Diagnosis not present

## 2018-09-16 DIAGNOSIS — N390 Urinary tract infection, site not specified: Secondary | ICD-10-CM | POA: Diagnosis not present

## 2018-09-16 DIAGNOSIS — R1110 Vomiting, unspecified: Secondary | ICD-10-CM | POA: Diagnosis not present

## 2018-09-16 DIAGNOSIS — R509 Fever, unspecified: Secondary | ICD-10-CM | POA: Diagnosis not present

## 2018-09-16 DIAGNOSIS — N12 Tubulo-interstitial nephritis, not specified as acute or chronic: Secondary | ICD-10-CM | POA: Diagnosis not present

## 2018-09-16 DIAGNOSIS — R000 Tachycardia, unspecified: Secondary | ICD-10-CM | POA: Diagnosis not present

## 2018-09-16 DIAGNOSIS — R109 Unspecified abdominal pain: Secondary | ICD-10-CM | POA: Diagnosis not present

## 2018-09-18 DIAGNOSIS — I5020 Unspecified systolic (congestive) heart failure: Secondary | ICD-10-CM | POA: Diagnosis not present

## 2018-09-18 DIAGNOSIS — N760 Acute vaginitis: Secondary | ICD-10-CM | POA: Diagnosis not present

## 2018-09-18 DIAGNOSIS — N10 Acute tubulo-interstitial nephritis: Secondary | ICD-10-CM | POA: Diagnosis not present

## 2018-09-18 DIAGNOSIS — Z01810 Encounter for preprocedural cardiovascular examination: Secondary | ICD-10-CM | POA: Diagnosis not present

## 2018-09-18 DIAGNOSIS — B9620 Unspecified Escherichia coli [E. coli] as the cause of diseases classified elsewhere: Secondary | ICD-10-CM | POA: Diagnosis not present

## 2019-10-04 ENCOUNTER — Other Ambulatory Visit: Payer: Self-pay

## 2019-10-04 DIAGNOSIS — F17210 Nicotine dependence, cigarettes, uncomplicated: Secondary | ICD-10-CM | POA: Diagnosis not present

## 2019-10-04 DIAGNOSIS — Z79899 Other long term (current) drug therapy: Secondary | ICD-10-CM | POA: Insufficient documentation

## 2019-10-04 DIAGNOSIS — N390 Urinary tract infection, site not specified: Secondary | ICD-10-CM | POA: Diagnosis not present

## 2019-10-04 DIAGNOSIS — R1013 Epigastric pain: Secondary | ICD-10-CM | POA: Diagnosis not present

## 2019-10-04 DIAGNOSIS — G43009 Migraine without aura, not intractable, without status migrainosus: Secondary | ICD-10-CM | POA: Insufficient documentation

## 2019-10-04 DIAGNOSIS — R519 Headache, unspecified: Secondary | ICD-10-CM | POA: Diagnosis present

## 2019-10-04 LAB — COMPREHENSIVE METABOLIC PANEL
ALT: 21 U/L (ref 0–44)
AST: 22 U/L (ref 15–41)
Albumin: 3.6 g/dL (ref 3.5–5.0)
Alkaline Phosphatase: 54 U/L (ref 38–126)
Anion gap: 6 (ref 5–15)
BUN: 9 mg/dL (ref 6–20)
CO2: 24 mmol/L (ref 22–32)
Calcium: 8.7 mg/dL — ABNORMAL LOW (ref 8.9–10.3)
Chloride: 108 mmol/L (ref 98–111)
Creatinine, Ser: 0.76 mg/dL (ref 0.44–1.00)
GFR calc Af Amer: >60 mL/min (ref >60)
GFR calc non Af Amer: >60 mL/min (ref >60)
Glucose, Bld: 120 mg/dL — ABNORMAL HIGH (ref 70–99)
Potassium: 4 mmol/L (ref 3.5–5.1)
Sodium: 138 mmol/L (ref 135–145)
Total Bilirubin: 0.6 mg/dL (ref 0.3–1.2)
Total Protein: 6.9 g/dL (ref 6.5–8.1)

## 2019-10-04 LAB — CBC
HCT: 40 % (ref 36.0–46.0)
Hemoglobin: 13 g/dL (ref 12.0–15.0)
MCH: 29.8 pg (ref 26.0–34.0)
MCHC: 32.5 g/dL (ref 30.0–36.0)
MCV: 91.7 fL (ref 80.0–100.0)
Platelets: 238 10*3/uL (ref 150–400)
RBC: 4.36 MIL/uL (ref 3.87–5.11)
RDW: 13.6 % (ref 11.5–15.5)
WBC: 6.6 10*3/uL (ref 4.0–10.5)
nRBC: 0 % (ref 0.0–0.2)

## 2019-10-04 LAB — LIPASE, BLOOD: Lipase: 18 U/L (ref 11–51)

## 2019-10-04 MED ORDER — IBUPROFEN 800 MG PO TABS
800.0000 mg | ORAL_TABLET | Freq: Once | ORAL | Status: AC
  Administered 2019-10-04: 22:00:00 800 mg via ORAL

## 2019-10-04 NOTE — ED Triage Notes (Signed)
Pt in with co headache x 2 days, denies any hx of the same. States is light sensitive, also started having mid abd pain today. Denies any n.v.d or dysuria, no fever.

## 2019-10-05 ENCOUNTER — Emergency Department
Admission: EM | Admit: 2019-10-05 | Discharge: 2019-10-05 | Disposition: A | Discharge status: Home / Self Care | Payer: Medicaid Other | Attending: Emergency Medicine | Admitting: Emergency Medicine

## 2019-10-05 DIAGNOSIS — G43009 Migraine without aura, not intractable, without status migrainosus: Secondary | ICD-10-CM

## 2019-10-05 DIAGNOSIS — N390 Urinary tract infection, site not specified: Secondary | ICD-10-CM

## 2019-10-05 DIAGNOSIS — R1013 Epigastric pain: Secondary | ICD-10-CM

## 2019-10-05 LAB — URINALYSIS, COMPLETE (UACMP) WITH MICROSCOPIC
Bilirubin Urine: NEGATIVE
Glucose, UA: NEGATIVE mg/dL
Hgb urine dipstick: NEGATIVE
Ketones, ur: NEGATIVE mg/dL
Nitrite: POSITIVE — AB
Protein, ur: NEGATIVE mg/dL
Specific Gravity, Urine: 1.015 (ref 1.005–1.030)
pH: 6 (ref 5.0–8.0)

## 2019-10-05 LAB — POCT PREGNANCY, URINE: Preg Test, Ur: NEGATIVE

## 2019-10-05 MED ORDER — CEPHALEXIN 500 MG PO CAPS
500.0000 mg | ORAL_CAPSULE | Freq: Once | ORAL | Status: AC
  Administered 2019-10-05: 06:00:00 500 mg via ORAL
  Filled 2019-10-05: qty 1

## 2019-10-05 MED ORDER — KETOROLAC TROMETHAMINE 30 MG/ML IJ SOLN
15.0000 mg | Freq: Once | INTRAMUSCULAR | Status: AC
  Administered 2019-10-05: 06:00:00 15 mg via INTRAVENOUS
  Filled 2019-10-05: qty 1

## 2019-10-05 MED ORDER — SODIUM CHLORIDE 0.9 % IV BOLUS
1000.0000 mL | Freq: Once | INTRAVENOUS | Status: AC
  Administered 2019-10-05: 06:00:00 1000 mL via INTRAVENOUS

## 2019-10-05 MED ORDER — DROPERIDOL 2.5 MG/ML IJ SOLN
2.5000 mg | Freq: Once | INTRAMUSCULAR | Status: AC
  Administered 2019-10-05: 2.5 mg via INTRAVENOUS
  Filled 2019-10-05: qty 2

## 2019-10-05 MED ORDER — CEPHALEXIN 500 MG PO CAPS: 500.0000 mg | ORAL_CAPSULE | Freq: Two times a day (BID) | ORAL | 0 refills | Status: DC

## 2019-10-05 MED ORDER — DEXAMETHASONE SODIUM PHOSPHATE 10 MG/ML IJ SOLN
10.0000 mg | Freq: Once | INTRAMUSCULAR | Status: AC
  Administered 2019-10-05: 10 mg via INTRAVENOUS
  Filled 2019-10-05: qty 1

## 2019-10-05 NOTE — ED Notes (Signed)
Pt signed paper copy of d/c and follow up instructions consent form

## 2019-10-05 NOTE — ED Provider Notes (Signed)
[facility] Emergency Department Provider Note  ____________________________________________   First MD Initiated Contact with Patient 10/05/19 [phone]     (approximate)  I have reviewed the triage vital signs and the nursing notes.   HISTORY  Chief Complaint Headache and Abdominal Pain    HPI Kathleen Tate is a 34 y.o. female with medical/psychiatric history as listed below  who presents for evaluation of a persistent severe sharp and stabbing global headache that has been present for the last several days.  She said it will mostly go away but then come right back.  Exertion and light seem to make it worse, nothing in particular makes it better.  She denies nausea and vomiting.  She developed some epigastric pain earlier tonight but thinks it is because she has been constipated for the last couple of days.  She denies dysuria and increased urinary frequency.  She denies fever/chills, sore throat, chest pain, shortness of breath, cough, and lower abdominal pain.  She has had a history of headaches but no official diagnosis of migraines.  She has not had any head trauma.          Past Medical History:  Diagnosis Date  . Depression   . Drug abuse ([place])   . Hepatitis C     Patient Active Problem List   Diagnosis Date Noted  . Hepatitis C 04/05/2016  . IV drug user 04/05/2016  . Hypotension 03/21/2016  . Hyponatremia 03/21/2016  . Acute renal insufficiency 03/21/2016  . Leukopenia 03/21/2016  . Thrombocytopenia ([place]) 03/21/2016  . Acute hepatitis 03/19/2016    No past surgical history on file.  Prior to Admission medications   Medication Sig Start Date End Date Taking? Authorizing Provider  albuterol (PROVENTIL HFA;VENTOLIN HFA) 108 (90 Base) MCG/ACT inhaler Inhale 2 puffs into the lungs every 6 (six) hours as needed for wheezing or shortness of breath. 04/05/16   Arlis Porta., MD  cephALEXin (KEFLEX) 500 MG capsule Take 1 capsule (500 mg  total) by mouth 2 (two) times daily. 10/05/19   Hinda Kehr, MD  citalopram (CELEXA) 20 MG tablet Take 20 mg by mouth daily.    [provider]  ondansetron (ZOFRAN ODT) 4 MG disintegrating tablet Take 1 tablet (4 mg total) by mouth every 8 (eight) hours as needed for nausea or vomiting. 03/21/16   Harvest Dark, MD    Allergies Cyclobenzaprine  Family History  Problem Relation Age of Onset  . Lung cancer Mother   . Lung cancer Father     Social History Social History   Tobacco Use  . Smoking status: Current Every Day Smoker    Packs/day: 0.50    Types: Cigarettes  . Smokeless tobacco: Never Used  Substance Use Topics  . Alcohol use: No  . Drug use: Yes    Types: Marijuana, Amphetamines, MDMA (Ecstacy)    Review of Systems Constitutional: No fever/chills Eyes: Photosensitivity. ENT: No sore throat. Cardiovascular: Denies chest pain. Respiratory: Denies shortness of breath. Gastrointestinal: Epigastric abdominal pain.  No nausea, no vomiting.  No diarrhea.  No constipation. Genitourinary: Negative for dysuria. Musculoskeletal: Negative for neck pain.  Negative for back pain. Integumentary: Negative for rash. Neurological: Generalized throbbing and sharp headache waxing and waning for a few days.  No focal weakness or numbness.   ____________________________________________   PHYSICAL EXAM:  VITAL SIGNS: ED Triage Vitals  Enc Vitals Group     BP 10/04/19 2205 (!) 130/95     Pulse Rate  10/04/19 2205 79     Resp 10/04/19 2205 20     Temp 10/04/19 2205 98.4 F (36.9 C)     Temp Source 10/04/19 2205 Oral     SpO2 10/04/19 2205 100 %     Weight 10/04/19 2206 54.4 kg (120 lb)     Height 10/04/19 2206 1.499 m (4\' 11" )     Head Circumference --      Peak Flow --      Pain Score 10/04/19 2206 10     Pain Loc --      Pain Edu? --      Excl. in GC? --     Constitutional: Alert and oriented.  Appears uncomfortable. Eyes: Conjunctivae are normal.   Pupils are equal and reactive but the patient has substantial photophobia. Head: Atraumatic. Nose: No congestion/rhinnorhea. Mouth/Throat: Patient is wearing a mask. Neck: No stridor.  No meningeal signs.   Cardiovascular: Normal rate, regular rhythm. Good peripheral circulation. Grossly normal heart sounds. Respiratory: Normal respiratory effort.  No retractions. Gastrointestinal: Soft nondistended.  Epigastric tenderness to palpation, no right upper quadrant tenderness and negative Murphy sign.  No lower abdominal tenderness, no rebound and no guarding. Musculoskeletal: No lower extremity tenderness nor edema. No gross deformities of extremities. Neurologic:  Normal speech and language. No gross focal neurologic deficits are appreciated.  Skin:  Skin is warm, dry and intact. Psychiatric: Mood and affect are normal. Speech and behavior are normal.  ____________________________________________   LABS (all labs ordered are listed, but only abnormal results are displayed)  Labs Reviewed  COMPREHENSIVE METABOLIC PANEL - Abnormal; Notable for the following components:      Result Value   Glucose, Bld 120 (*)    Calcium 8.7 (*)    All other components within normal limits  URINALYSIS, COMPLETE (UACMP) WITH MICROSCOPIC - Abnormal; Notable for the following components:   Color, Urine YELLOW (*)    APPearance CLOUDY (*)    Nitrite POSITIVE (*)    Leukocytes,Ua LARGE (*)    Bacteria, UA MANY (*)    All other components within normal limits  URINE CULTURE  CBC  LIPASE, BLOOD  POC URINE PREG, ED  POCT PREGNANCY, URINE   ____________________________________________  EKG  No indication for emergent EKG ____________________________________________  RADIOLOGY I, 2207, personally viewed and evaluated these images (plain radiographs) as part of my medical decision making, as well as reviewing the written report by the radiologist.  ED MD interpretation: No indication for  emergent imaging  Official radiology report(s): No results found.  ____________________________________________   PROCEDURES   Procedure(s) performed (including Critical Care):  Procedures   ____________________________________________   INITIAL IMPRESSION / MDM / ASSESSMENT AND PLAN / ED COURSE  As part of my medical decision making, I reviewed the following data within the electronic MEDICAL RECORD NUMBER Nursing notes reviewed and incorporated, Labs reviewed , Old chart reviewed, Notes from prior ED visits and [place] Controlled Substance Database   Differential diagnosis includes, but is not limited to, intracranial hemorrhage, meningitis/encephalitis, previous head trauma, cavernous venous thrombosis, tension headache, temporal arteritis, migraine or migraine equivalent, idiopathic intracranial hypertension, and non-specific headache.  Suspect migraine and will treat empirically with the following: Droperidol 2.5 mg IV, Toradol 15 mg IV, normal saline bolus, Decadron 10 mg IV.  Patient feels like her abdominal pain is due to constipation and there is no indication for further evaluation at this time.  Her CMP is and CBC are normal with no LFT elevation and  no leukocytosis, lipase is normal.  I will reassess after medication but anticipate discharge with outpatient follow-up and the patient is comfortable with that plan.  Also ordering Keflex 500 mg by mouth for urinary tract infection although she is asymptomatic.  She has had pyelonephritis in the past based on her medical record which she confirmed.        Clinical Course as of Oct 05 707  Fri Oct 05, 2019  4403 Positive urinary tract infection, ordering urine culture  Urinalysis, Complete w Microscopic(!) [CF]  [phone] Patient resting comfortably.  She said that her headache has improved although not completely resolved.  She is comfortable with the plan to go home after her fluids are finished.   [CF]    Clinical Course User  Index [CF] Loleta Rose, MD     ____________________________________________  FINAL CLINICAL IMPRESSION(S) / ED DIAGNOSES  Final diagnoses:  Migraine without aura and without status migrainosus, not intractable  Epigastric pain  Urinary tract infection without hematuria, site unspecified     MEDICATIONS GIVEN DURING THIS VISIT:  Medications  ibuprofen (ADVIL) tablet 800 mg (800 mg Oral Given 10/04/19 2215)  droperidol (INAPSINE) 2.5 MG/ML injection 2.5 mg (2.5 mg Intravenous Given 10/05/19 0532)  ketorolac (TORADOL) 30 MG/ML injection 15 mg (15 mg Intravenous Given 10/05/19 0531)  sodium chloride 0.9 % bolus 1,000 mL (1,000 mLs Intravenous New Bag/Given 10/05/19 0531)  dexamethasone (DECADRON) injection 10 mg (10 mg Intravenous Given 10/05/19 0533)  cephALEXin (KEFLEX) capsule 500 mg (500 mg Oral Given 10/05/19 0539)     ED Discharge Orders         Ordered    cephALEXin (KEFLEX) 500 MG capsule  2 times daily     10/05/19 5956          *Please note:  Katherine Basset Sundquist was evaluated in Emergency Department on 10/05/2019 for the symptoms described in the history of present illness. She was evaluated in the context of the global COVID-19 pandemic, which necessitated consideration that the patient might be at risk for infection with the SARS-CoV-2 virus that causes COVID-19. Institutional protocols and algorithms that pertain to the evaluation of patients at risk for COVID-19 are in a state of rapid change based on information released by regulatory bodies including the CDC and federal and state organizations. These policies and algorithms were followed during the patient's care in the ED.  Some ED evaluations and interventions may be delayed as a result of limited staffing during the pandemic.*  Note:  This document was prepared using Dragon voice recognition software and may include unintentional dictation errors.   Loleta Rose, MD 10/05/19 [phone]

## 2019-10-05 NOTE — Discharge Instructions (Addendum)
You have been seen in the Emergency Department (ED) for a migraine.  Please use Tylenol or Motrin as needed for symptoms, but only as written on the box, and take any regular medications that have been prescribed for you.  Please also take the full course of prescribed antibiotics for your urinary tract infection.  Do not stop taking the medicine early and complete the full course prescribed.  As we have discussed, please follow up with your doctor as soon as possible regarding today's ED visit and your headache symptoms.    Call your doctor or return to the Emergency Department (ED) if you have a worsening headache, sudden and severe headache, confusion, slurred speech, facial droop, weakness or numbness in any arm or leg, extreme fatigue, or other symptoms that concern you.

## 2019-10-05 NOTE — ED Notes (Addendum)
Pt given graham crackers and peanut butter and warm blanket. Pt states that she is now comfortable and will try to take a nap.

## 2019-10-05 NOTE — ED Notes (Signed)
Pt phone battery was dead at d/c. Nurse let pt use personal charger to turn phone on to obtain phone numbers to get a ride home

## 2019-10-07 LAB — URINE CULTURE
Culture: >=100000 — AB
Special Requests: NORMAL

## 2019-10-08 NOTE — Progress Notes (Signed)
Brief Pharmacy Note  Patient is a 34 y/o F with medical history including IVDU, Hepatitis C, BV, and pyelonephritis who presented to [facility] ED on 2/11 with chief complaint of headache and abdominal pain. Denied urinary symptoms. No leukocytosis and afebrile. POC pregnancy test negative. UA notable for many bacteria, 21-50 RBC, 21-50 WBC, 11-20 squamous cells. Patient discharged on cephalexin.   Urine culture from ED visit has resulted >=100k colonies/mL E coli (ESBL producer, cipro R, nitrofurantoin R, TMP-SMX R). Discharge antibiotic without activity. Discussed case with Dr. Colon Branch and received verbal prescription for fosfomycin 3 g x 1. Prescription called into [facility] in [place]. Attempted to reach patient via telephone # in chart which belongs to mother of patient's children's father. Relation informed caller that patient's cell phone is un-available but that she has frequent contact with patient. Relation informed caller that she picked up most recent antibiotic on behalf of patient. Informed relation that new prescription for patient has been called into [facility] in [place].   Dorothea Ogle Pharmacy Resident 08 October 2019

## 2020-11-14 ENCOUNTER — Other Ambulatory Visit: Payer: Self-pay

## 2020-11-14 ENCOUNTER — Emergency Department: Payer: Medicaid Other

## 2020-11-14 ENCOUNTER — Emergency Department
Admission: EM | Admit: 2020-11-14 | Discharge: 2020-11-15 | Disposition: A | Discharge status: Home / Self Care | Payer: Medicaid Other | Attending: Emergency Medicine | Admitting: Emergency Medicine

## 2020-11-14 DIAGNOSIS — F17210 Nicotine dependence, cigarettes, uncomplicated: Secondary | ICD-10-CM | POA: Insufficient documentation

## 2020-11-14 DIAGNOSIS — N898 Other specified noninflammatory disorders of vagina: Secondary | ICD-10-CM | POA: Diagnosis not present

## 2020-11-14 DIAGNOSIS — R102 Pelvic and perineal pain: Secondary | ICD-10-CM | POA: Diagnosis not present

## 2020-11-14 DIAGNOSIS — N760 Acute vaginitis: Secondary | ICD-10-CM | POA: Diagnosis not present

## 2020-11-14 DIAGNOSIS — R9389 Abnormal findings on diagnostic imaging of other specified body structures: Secondary | ICD-10-CM | POA: Diagnosis not present

## 2020-11-14 DIAGNOSIS — B9689 Other specified bacterial agents as the cause of diseases classified elsewhere: Secondary | ICD-10-CM

## 2020-11-14 DIAGNOSIS — Z79899 Other long term (current) drug therapy: Secondary | ICD-10-CM | POA: Diagnosis not present

## 2020-11-14 LAB — COMPREHENSIVE METABOLIC PANEL
ALT: 21 U/L (ref 0–44)
AST: 24 U/L (ref 15–41)
Albumin: 4.1 g/dL (ref 3.5–5.0)
Alkaline Phosphatase: 56 U/L (ref 38–126)
Anion gap: 9 (ref 5–15)
BUN: 14 mg/dL (ref 6–20)
CO2: 26 mmol/L (ref 22–32)
Calcium: 9.2 mg/dL (ref 8.9–10.3)
Chloride: 106 mmol/L (ref 98–111)
Creatinine, Ser: 0.95 mg/dL (ref 0.44–1.00)
GFR, Estimated: >60 mL/min (ref >60)
Glucose, Bld: 90 mg/dL (ref 70–99)
Potassium: 3.2 mmol/L — ABNORMAL LOW (ref 3.5–5.1)
Sodium: 141 mmol/L (ref 135–145)
Total Bilirubin: 0.6 mg/dL (ref 0.3–1.2)
Total Protein: 7.5 g/dL (ref 6.5–8.1)

## 2020-11-14 LAB — CBC WITH DIFFERENTIAL/PLATELET
Abs Immature Granulocytes: 0.03 10*3/uL (ref 0.00–0.07)
Basophils Absolute: 0 10*3/uL (ref 0.0–0.1)
Basophils Relative: 0 %
Eosinophils Absolute: 0.5 10*3/uL (ref 0.0–0.5)
Eosinophils Relative: 4 %
HCT: 40.6 % (ref 36.0–46.0)
Hemoglobin: 13.1 g/dL (ref 12.0–15.0)
Immature Granulocytes: 0 %
Lymphocytes Relative: 27 %
Lymphs Abs: 3.1 10*3/uL (ref 0.7–4.0)
MCH: 29.4 pg (ref 26.0–34.0)
MCHC: 32.3 g/dL (ref 30.0–36.0)
MCV: 91.2 fL (ref 80.0–100.0)
Monocytes Absolute: 0.8 10*3/uL (ref 0.1–1.0)
Monocytes Relative: 7 %
Neutro Abs: 7.3 10*3/uL (ref 1.7–7.7)
Neutrophils Relative %: 62 %
Platelets: 262 10*3/uL (ref 150–400)
RBC: 4.45 MIL/uL (ref 3.87–5.11)
RDW: 13.5 % (ref 11.5–15.5)
WBC: 11.7 10*3/uL — ABNORMAL HIGH (ref 4.0–10.5)
nRBC: 0 % (ref 0.0–0.2)

## 2020-11-14 LAB — URINALYSIS, ROUTINE W REFLEX MICROSCOPIC
Bacteria, UA: NONE SEEN
Bilirubin Urine: NEGATIVE
Glucose, UA: NEGATIVE mg/dL
Hgb urine dipstick: NEGATIVE
Ketones, ur: 5 mg/dL — AB
Nitrite: NEGATIVE
Protein, ur: NEGATIVE mg/dL
Specific Gravity, Urine: 1.029 (ref 1.005–1.030)
pH: 5 (ref 5.0–8.0)

## 2020-11-14 LAB — WET PREP, GENITAL
Sperm: NONE SEEN
Trich, Wet Prep: NONE SEEN
Yeast Wet Prep HPF POC: NONE SEEN

## 2020-11-14 LAB — LIPASE, BLOOD: Lipase: 31 U/L (ref 11–51)

## 2020-11-14 LAB — POC URINE PREG, ED: Preg Test, Ur: NEGATIVE

## 2020-11-14 MED ORDER — LIDOCAINE HCL (PF) 1 % IJ SOLN: 2.1000 mL | Freq: Once | INTRAMUSCULAR | Status: AC

## 2020-11-14 MED ORDER — DOXYCYCLINE HYCLATE 100 MG PO CAPS: 100.0000 mg | ORAL_CAPSULE | Freq: Two times a day (BID) | ORAL | 0 refills | Status: DC

## 2020-11-14 MED ORDER — LIDOCAINE HCL (PF) 1 % IJ SOLN
INTRAMUSCULAR | Status: AC
  Administered 2020-11-15: 2.1 mL
  Filled 2020-11-14: qty 5

## 2020-11-14 MED ORDER — CEFTRIAXONE SODIUM 1 G IJ SOLR
500.0000 mg | Freq: Once | INTRAMUSCULAR | Status: AC
  Administered 2020-11-15: 500 mg via INTRAMUSCULAR
  Filled 2020-11-14: qty 10

## 2020-11-14 MED ORDER — METRONIDAZOLE 500 MG PO TABS: 500.0000 mg | ORAL_TABLET | Freq: Two times a day (BID) | ORAL | 0 refills | Status: AC

## 2020-11-14 NOTE — ED Provider Notes (Signed)
[facility] Emergency Department Provider Note  ____________________________________________   I have reviewed the triage vital signs and the nursing notes.   HISTORY  Chief Complaint Abdominal Pain and Vaginal Discharge   History limited by: Not Limited   HPI Kathleen Tate is a 35 y.o. female who presents to the emergency department today because of concerns for abdominal pain and abnormal vaginal discharge.  Patient states that her symptoms started 2 days ago.  She describes the abdominal pain as being located in the suprapubic region.  Along with this she has noticed some yellow and green vaginal discharge.  Patient denies any painful urination.  She denies any fevers.  Denies any nausea or vomiting.   Records reviewed. Per medical record review patient has a history of hepatitis.   Past Medical History:  Diagnosis Date  . Depression   . Drug abuse (HCC)   . Hepatitis C     Patient Active Problem List   Diagnosis Date Noted  . Hepatitis C 04/05/2016  . IV drug user 04/05/2016  . Hypotension 03/21/2016  . Hyponatremia 03/21/2016  . Acute renal insufficiency 03/21/2016  . Leukopenia 03/21/2016  . Thrombocytopenia (HCC) 03/21/2016  . Acute hepatitis 03/19/2016    History reviewed. No pertinent surgical history.  Prior to Admission medications   Medication Sig Start Date End Date Taking? Authorizing Provider  albuterol (PROVENTIL HFA;VENTOLIN HFA) 108 (90 Base) MCG/ACT inhaler Inhale 2 puffs into the lungs every 6 (six) hours as needed for wheezing or shortness of breath. 04/05/16   Janeann Forehand., MD  cephALEXin (KEFLEX) 500 MG capsule Take 1 capsule (500 mg total) by mouth 2 (two) times daily. 10/05/19   Loleta Rose, MD  citalopram (CELEXA) 20 MG tablet Take 20 mg by mouth daily.    [provider]  ondansetron (ZOFRAN ODT) 4 MG disintegrating tablet Take 1 tablet (4 mg total) by mouth every 8 (eight) hours as needed for nausea or  vomiting. 03/21/16   Minna Antis, MD    Allergies Cyclobenzaprine  Family History  Problem Relation Age of Onset  . Lung cancer Mother   . Lung cancer Father     Social History Social History   Tobacco Use  . Smoking status: Current Every Day Smoker    Packs/day: 0.50    Types: Cigarettes  . Smokeless tobacco: Never Used  Substance Use Topics  . Alcohol use: No  . Drug use: Yes    Types: Marijuana, Amphetamines, MDMA (Ecstacy)    Review of Systems Constitutional: No fever/chills Eyes: No visual changes. ENT: No sore throat. Cardiovascular: Denies chest pain. Respiratory: Denies shortness of breath. Gastrointestinal: Positive for lower abdominal pain.  Genitourinary: Positive for vaginal discharge.  Musculoskeletal: Negative for back pain. Skin: Negative for rash. Neurological: Negative for headaches, focal weakness or numbness.  ____________________________________________   PHYSICAL EXAM:  VITAL SIGNS: ED Triage Vitals [11/14/20 2122]  Enc Vitals Group     BP (!) 132/102     Pulse Rate 88     Resp 18     Temp 98.1 F (36.7 C)     Temp Source Oral     SpO2 98 %     Weight 140 lb (63.5 kg)     Height 4\' 11"  (1.499 m)     Head Circumference      Peak Flow      Pain Score 6   Constitutional: Alert and oriented.  Eyes: Conjunctivae are normal.  ENT  Head: Normocephalic and atraumatic.      Nose: No congestion/rhinnorhea.      Mouth/Throat: Mucous membranes are moist.      Neck: No stridor. Hematological/Lymphatic/Immunilogical: No cervical lymphadenopathy. Cardiovascular: Normal rate, regular rhythm.  No murmurs, rubs, or gallops.  Respiratory: Normal respiratory effort without tachypnea nor retractions. Breath sounds are clear and equal bilaterally. No wheezes/rales/rhonchi. Gastrointestinal: Soft and tender in the suprapubic and right lower quadrant.  Genitourinary: Deferred Musculoskeletal: Normal range of motion in all extremities. No  lower extremity edema. Neurologic:  Normal speech and language. No gross focal neurologic deficits are appreciated.  Skin:  Skin is warm, dry and intact. No rash noted. Psychiatric: Mood and affect are normal. Speech and behavior are normal. Patient exhibits appropriate insight and judgment.  ____________________________________________    LABS (pertinent positives/negatives)  Upreg negative CBC wbc 11.7, hgb 13.1, plt 262 Lipase 31 UA cloudy, large leukocytes, 0-5 rbc and wbc CMP wnl except k 3.2 ____________________________________________   EKG  None  ____________________________________________    RADIOLOGY  [place] pending  ____________________________________________   PROCEDURES  Procedures  ____________________________________________   INITIAL IMPRESSION / ASSESSMENT AND PLAN / ED COURSE  Pertinent labs & imaging results that were available during my care of the patient were reviewed by me and considered in my medical decision making (see chart for details).   Patient presented to the emergency department today because of concerns for abdominal pain and vaginal discharge.  Patient denies any fevers.  On exam patient was tender in the suprapubic lower quadrant.  This time I have a low suspicion for appendicitis given lack of fever or significant leukocytosis. Will send a wet prep and GC/CT. Will order pelvic US.   ____________________________________________   FINAL CLINICAL IMPRESSION(S) / ED DIAGNOSES  Final diagnoses:  Vaginal discharge     Note: This dictation was prepared with Dragon dictation. Any transcriptional errors that result from this process are unintentional     Phineas Semen, MD 11/14/20 2326

## 2020-11-14 NOTE — ED Triage Notes (Signed)
Lower abd pain and green vaginal discharge x 2 days. Denies known exposure to STD. Denies fever or n/v.

## 2020-11-14 NOTE — Discharge Instructions (Addendum)
As we discussed, your ultrasound is concerning for a thickened lining of the uterus.  It is important they follow-up with OB/GYN for further evaluation of this.  You may need a biopsy to rule out endometrial cancer.  In the meantime your vaginal swabs are pending to rule out an STD.  However without a pelvic exam I feel that it is important that you take antibiotics at home in case you do have a STD.  You were given a shot here and I am discharging you on 2 antibiotics twice a day for 7 days.  Make sure to follow-up with OB/GYN after that.  Return to the emergency room for new or worsening abdominal pain or fever.

## 2020-11-14 NOTE — ED Notes (Signed)
Pt in US at this time 

## 2020-11-15 LAB — CHLAMYDIA/NGC RT PCR (ARMC ONLY)
Chlamydia Tr: NOT DETECTED
N gonorrhoeae: NOT DETECTED

## 2020-11-15 NOTE — ED Provider Notes (Signed)
I was asked by Dr. Derrill Kay to follow up the results of patient's [place] which did not show any signs of tubo-ovarian abscess but did show thickened endometrium.  Patient's last menstrual period was 4 weeks ago.  I discussed this finding with her and recommended close follow-up with OB/GYN for repeat ultrasound and if it is persistently thickened she would need a biopsy.  Patient had not had a pelvic exam done which I recommended at this time.  Patient is here with her son and is refusing the test because he is here and because she reports that she needs to go home right now.  She did have pelvic swabs done and the wet prep is positive for BV.  GC and Chlamydia are pending.  She was given a shot of Rocephin and I will send her home on a prescription for doxycycline and flagyl for 7 days for possible STD/PID and BV, especially without a pelvic exam to determine if patient has CMT. I explained to the patient the importance of having a pelvic exam but she reports that she would like to try the antibiotics first and if she still having pain and discharge she will follow-up with her OB/GYN and have a pelvic exam done at that time.  We did discuss the possibility of this infection develop into a tubo-ovarian abscess if its not treated appropriately.  She does understand the risks.  We will discharge her home at this time and a referral for OB/GYN.    I have personally reviewed the images performed during this visit and I agree with the Radiologist's read.   Interpretation by Radiologist:  US PELVIC COMPLETE WITH TRANSVAGINAL  Result Date: 11/14/2020 CLINICAL DATA:  Initial evaluation for acute right adnexal pain. EXAM: TRANSABDOMINAL AND TRANSVAGINAL ULTRASOUND OF PELVIS TECHNIQUE: Both transabdominal and transvaginal ultrasound examinations of the pelvis were performed. Transabdominal technique was performed for global imaging of the pelvis including uterus, ovaries, adnexal regions, and pelvic cul-de-sac. It was  necessary to proceed with endovaginal exam following the transabdominal exam to visualize the uterus, endometrium, and ovaries. COMPARISON:  None FINDINGS: Uterus Measurements: 9.4 x 4.4 x 5.0 cm = volume: 108 mL. No fibroids or other mass visualized. Endometrium Thickness: 13.2 mm. Increased vascularity seen within the endometrial complex, of uncertain significance. No visible focal lesion. Right ovary Measurements: 3.1 x 2.2 x 1.6 cm = volume: 5.6 mL. Normal appearance/no adnexal mass. Left ovary Measurements: 3.7 x 2.4 x 2.6 cm = volume: 13.3 mL. Normal appearance/no adnexal mass. Small dominant follicle noted. Other findings No abnormal free fluid. IMPRESSION: 1. Increased vascularity within the endometrial complex without visible focal lesion, of uncertain significance. Correlation with any potential symptomatology such as vaginal bleeding recommended. Gynecologic referral with possible sonohysterography and potential endometrial sampling could be performed as warranted. 2. Otherwise unremarkable and normal pelvic ultrasound. No other findings to explain patient's symptoms identified. Electronically Signed   By: Rise Mu M.D.   On: 11/14/2020 23:49      Nita Sickle, MD 11/15/20 [phone]

## 2020-11-16 LAB — URINE CULTURE: Culture: NO GROWTH

## 2020-11-17 ENCOUNTER — Telehealth: Payer: Self-pay | Admitting: *Deleted

## 2020-11-17 NOTE — Telephone Encounter (Signed)
Transition Care Management Follow-up Telephone Call  Date of discharge and from where: 11/15/2020 [facility] ED  How have you been since you were released from the hospital? "Fine"  Any questions or concerns? No  Items Reviewed:  Did the pt receive and understand the discharge instructions provided? Yes   Medications obtained and verified? Yes   Other? No   Any new allergies since your discharge? No   Dietary orders reviewed? No  Do you have support at home? Yes   Home Care and Equipment/Supplies: Were home health services ordered? not applicable If so, what is the name of the agency? N/A  Has the agency set up a time to come to the patient's home? not applicable Were any new equipment or medical supplies ordered?  No What is the name of the medical supply agency? N/A Were you able to get the supplies/equipment? not applicable Do you have any questions related to the use of the equipment or supplies? No  Functional Questionnaire: (I = Independent and D = Dependent) ADLs: I  Bathing/Dressing- I  Meal Prep- I  Eating- I  Maintaining continence- I  Transferring/Ambulation- I  Managing Meds- I  Follow up appointments reviewed:   PCP Hospital f/u appt confirmed? No    Specialist Hospital f/u appt confirmed? No    Are transportation arrangements needed? No   If their condition worsens, is the pt aware to call PCP or go to the Emergency Dept.? Yes  Was the patient provided with contact information for the PCP's office or ED? Yes  Was to pt encouraged to call back with questions or concerns? Yes

## 2021-02-06 ENCOUNTER — Telehealth: Payer: Self-pay | Admitting: Family Medicine

## 2021-02-06 NOTE — Telephone Encounter (Signed)
..   Medicaid Managed Care   Unsuccessful Outreach Note  02/06/2021 Name: Kathleen Tate MRN: [id number] DOB: 15-May-1986  Referred by: Janeann Forehand., MD (Inactive) Reason for referral : High Risk Managed Medicaid (Attempted to reach [name] today to get her scheduled for a phone visit with the [facility] Team. I left her a message on her VM to please return my call.)   An unsuccessful telephone outreach was attempted today. The patient was referred to the case management team for assistance with care management and care coordination.   Follow Up Plan: The care management team will reach out to the patient again over the next 7-14 days.   Weston Settle Care Guide, High Risk Medicaid Managed Care Embedded Care Coordination [facility]  [facility]

## 2021-02-26 ENCOUNTER — Telehealth: Payer: Self-pay | Admitting: Family Medicine

## 2021-02-26 NOTE — Telephone Encounter (Signed)
..   Medicaid Managed Care   Unsuccessful Outreach Note  02/26/2021 Name: Kathleen Tate MRN: [id number] DOB: 1986-08-23  Referred by: Janeann Forehand., MD (Inactive) Reason for referral : High Risk Managed Medicaid (Attempted to reach Ms.Bartolucci today to get her scheduled with the Managed Medicaid Team for a phone visit. I left my name and number on her VM.)   A second unsuccessful telephone outreach was attempted today. The patient was referred to the case management team for assistance with care management and care coordination.   Follow Up Plan: The care management team will reach out to the patient again over the next 7-14 days.   Weston Settle Care Guide, High Risk Medicaid Managed Care Embedded Care Coordination [facility]  [facility]

## 2021-03-11 ENCOUNTER — Telehealth: Payer: Self-pay | Admitting: Family Medicine

## 2021-03-11 NOTE — Telephone Encounter (Signed)
..   Medicaid Managed Care   Unsuccessful Outreach Note  03/11/2021 Name: Kathleen Tate MRN: [id number] DOB: [birth year]  Referred by: Janeann Forehand., MD (Inactive) Reason for referral : High Risk Managed Medicaid (Attempted to reach patient today to get her scheduled with the MM Team for a phone visit. I left my name and number on her VM.)   Third unsuccessful telephone outreach was attempted today. The patient was referred to the case management team for assistance with care management and care coordination. The patient's primary care provider has been notified of our unsuccessful attempts to make or maintain contact with the patient. The care management team is pleased to engage with this patient at any time in the future should he/she be interested in assistance from the care management team.   Follow Up Plan: We have been unable to make contact with the patient for follow up. The care management team is available to follow up with the patient after provider conversation with the patient regarding recommendation for care management engagement and subsequent re-referral to the care management team.   Weston Settle Care Guide, High Risk Medicaid Managed Care Embedded Care Coordination [facility]  [facility]

## 2021-10-14 ENCOUNTER — Emergency Department: Payer: Medicaid Other

## 2021-10-14 ENCOUNTER — Emergency Department
Admission: EM | Admit: 2021-10-14 | Discharge: 2021-10-14 | Disposition: A | Discharge status: Home / Self Care | Payer: Medicaid Other | Attending: Emergency Medicine | Admitting: Emergency Medicine

## 2021-10-14 DIAGNOSIS — L03011 Cellulitis of right finger: Secondary | ICD-10-CM | POA: Insufficient documentation

## 2021-10-14 DIAGNOSIS — M79644 Pain in right finger(s): Secondary | ICD-10-CM | POA: Diagnosis not present

## 2021-10-14 DIAGNOSIS — M79641 Pain in right hand: Secondary | ICD-10-CM | POA: Diagnosis present

## 2021-10-14 DIAGNOSIS — Z79899 Other long term (current) drug therapy: Secondary | ICD-10-CM | POA: Insufficient documentation

## 2021-10-14 DIAGNOSIS — M7989 Other specified soft tissue disorders: Secondary | ICD-10-CM | POA: Diagnosis not present

## 2021-10-14 MED ORDER — LEVOFLOXACIN 750 MG PO TABS
750.0000 mg | ORAL_TABLET | Freq: Once | ORAL | Status: AC
  Administered 2021-10-14: 750 mg via ORAL
  Filled 2021-10-14: qty 1

## 2021-10-14 MED ORDER — CEPHALEXIN 500 MG PO CAPS
500.0000 mg | ORAL_CAPSULE | Freq: Once | ORAL | Status: AC
  Administered 2021-10-14: 500 mg via ORAL
  Filled 2021-10-14: qty 1

## 2021-10-14 MED ORDER — CEPHALEXIN 500 MG PO CAPS
500.0000 mg | ORAL_CAPSULE | Freq: Four times a day (QID) | ORAL | 0 refills | Status: AC
Start: 2021-10-14 — End: 2021-10-21

## 2021-10-14 MED ORDER — LEVOFLOXACIN 750 MG PO TABS: 750.0000 mg | ORAL_TABLET | Freq: Every day | ORAL | 0 refills | Status: AC

## 2021-10-14 NOTE — ED Triage Notes (Signed)
Pt presents via POV c/o right index finger pain and swelling. 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

## 2021-10-14 NOTE — Discharge Instructions (Signed)
Please soak right index finger and half warm water and half hydrogen peroxide 5 minutes 3 times daily.  Please take antibiotics as prescribed and return to the ER for any increasing pain swelling warmth/redness or inability to bend the finger.

## 2021-10-14 NOTE — ED Provider Notes (Signed)
[facility] EMERGENCY DEPARTMENT Provider Note   CSN: [id number] Arrival date & time: 10/14/21  2052     History  Chief Complaint  Patient presents with   Hand Pain    Kathleen Tate is a 36 y.o. female presents to the emergency department for evaluation of right index finger cellulitis.  This morning she woke up with redness and swelling in the right index finger.  She is able to bend the digit at MCP PIP and DIP joint but has some stiffness along the middle phalanx along with slight redness and warmth.  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  She does not feel a foreign body and denies any wounds.  Her tetanus is up-to-date.  She denies any trauma or injury  HPI     Home Medications Prior to Admission medications   Medication Sig Start Date End Date Taking? Authorizing Provider  cephALEXin (KEFLEX) 500 MG capsule Take 1 capsule (500 mg total) by mouth 4 (four) times daily for 7 days. 10/14/21 10/21/21 Yes Duanne Guess, PA-C  levofloxacin (LEVAQUIN) 750 MG tablet Take 1 tablet (750 mg total) by mouth daily for 7 days. 10/14/21 10/21/21 Yes Duanne Guess, PA-C  albuterol (PROVENTIL HFA;VENTOLIN HFA) 108 (90 Base) MCG/ACT inhaler Inhale 2 puffs into the lungs every 6 (six) hours as needed for wheezing or shortness of breath. 04/05/16   Arlis Porta., MD  citalopram (CELEXA) 20 MG tablet Take 20 mg by mouth daily.    [provider]  doxycycline (VIBRAMYCIN) 100 MG capsule Take 1 capsule (100 mg total) by mouth 2 (two) times daily. 11/14/20   Nance Pear, MD  ondansetron (ZOFRAN ODT) 4 MG disintegrating tablet Take 1 tablet (4 mg total) by mouth every 8 (eight) hours as needed for nausea or vomiting. 03/21/16   Harvest Dark, MD      Allergies    Cyclobenzaprine    Review of Systems   Review of Systems  Physical Exam Updated Vital Signs BP (!) 137/99 (BP Location: Left Arm)    Pulse 85    Temp 98.8 F  (37.1 C) (Oral)    Resp 15    LMP 09/29/2021    SpO2 99%  Physical Exam Constitutional:      Appearance: She is well-developed.  HENT:     Head: Normocephalic and atraumatic.  Eyes:     Conjunctiva/sclera: Conjunctivae normal.  Cardiovascular:     Rate and Rhythm: Normal rate.  Pulmonary:     Effort: Pulmonary effort is normal. No respiratory distress.  Musculoskeletal:        General: Normal range of motion.     Cervical back: Normal range of motion.     Comments: Right index finger with slight swelling, redness along the volar aspect of the middle and distal phalanx.  Minimal dorsal erythema but patient is able to fully flex the MCP PIP and DIP joint with no signs of flexor tenosynovitis.  No paronychia or pulp space swelling or redness.  No visible or palpable foreign body.  Skin:    General: Skin is warm.     Findings: No rash.  Neurological:     Mental Status: She is alert and oriented to person, place, and time.  Psychiatric:        Behavior: Behavior normal.        Thought Content: Thought content normal.    ED Results / Procedures / Treatments   Labs (  all labs ordered are listed, but only abnormal results are displayed) Labs Reviewed - No data to display  EKG None  Radiology DG Finger Index Right  Result Date: 10/14/2021 CLINICAL DATA:  Index finger pain and swelling EXAM: RIGHT INDEX FINGER 2+V COMPARISON:  None. FINDINGS: No fracture or malalignment. Diffuse soft tissue swelling without radiopaque foreign body or emphysema IMPRESSION: Soft tissue swelling without acute osseous abnormality Electronically Signed   By: Donavan Foil M.D.   On: 10/14/2021 21:31    Procedures Procedures   Medications Ordered in ED Medications  cephALEXin (KEFLEX) capsule 500 mg (has no administration in time range)  levofloxacin (LEVAQUIN) tablet 750 mg (has no administration in time range)    ED Course/ Medical Decision Making/ A&P                           Medical Decision  Making Risk Prescription drug management.   36 year old female with right index finger cellulitis, has some pain and swelling.  No signs of infectious flexor tenosynovitis or pulp space infection.  No paronychial infection.  No foreign body seen on the right index finger x-rays nor any signs of osteomyelitis or acute bony abnormality.  Due to water exposure and cellulitis will treat with cephalexin and Levaquin.  Will have patient call orthopedics to schedule follow-up appointment but if any increasing pain swelling fevers or inability to flex the index finger would recommend urgent return to the ER.  Patient is understanding and agreeable to these conditions.  She understands signs symptoms return to the ER for. Final Clinical Impression(s) / ED Diagnoses Final diagnoses:  Cellulitis of right index finger    Rx / DC Orders ED Discharge Orders          Ordered    cephALEXin (KEFLEX) 500 MG capsule  4 times daily        10/14/21 2205    levofloxacin (LEVAQUIN) 750 MG tablet  Daily        10/14/21 2205              Renata Caprice 10/14/21 2211    Arta Silence, MD 10/14/21 2253

## 2021-10-15 ENCOUNTER — Telehealth: Payer: Self-pay

## 2021-10-15 NOTE — Telephone Encounter (Signed)
Transition Care Management Unsuccessful Follow-up Telephone Call  Date of discharge and from where:  10/14/2021-[facility]  Attempts:  1st Attempt  Reason for unsuccessful TCM follow-up call:  Voice mail full

## 2021-10-16 NOTE — Telephone Encounter (Signed)
Transition Care Management Unsuccessful Follow-up Telephone Call  Date of discharge and from where:  10/14/2021-[facility]  Attempts:  2nd Attempt  Reason for unsuccessful TCM follow-up call:  Voice mail full

## 2021-10-19 NOTE — Telephone Encounter (Signed)
Transition Care Management Unsuccessful Follow-up Telephone Call  Date of discharge and from where:  10/14/2021-[facility]  Attempts:  3rd Attempt  Reason for unsuccessful TCM follow-up call:  Voice mail full

## 2021-12-23 ENCOUNTER — Emergency Department
Admission: EM | Admit: 2021-12-23 | Discharge: 2021-12-23 | Disposition: A | Discharge status: Home / Self Care | Payer: Medicaid Other | Attending: Emergency Medicine | Admitting: Emergency Medicine

## 2021-12-23 ENCOUNTER — Other Ambulatory Visit: Payer: Self-pay

## 2021-12-23 DIAGNOSIS — Z20822 Contact with and (suspected) exposure to covid-19: Secondary | ICD-10-CM | POA: Diagnosis not present

## 2021-12-23 DIAGNOSIS — J020 Streptococcal pharyngitis: Secondary | ICD-10-CM | POA: Insufficient documentation

## 2021-12-23 DIAGNOSIS — J029 Acute pharyngitis, unspecified: Secondary | ICD-10-CM | POA: Diagnosis present

## 2021-12-23 LAB — RESP PANEL BY RT-PCR (FLU A&B, COVID) ARPGX2
Influenza A by PCR: NEGATIVE
Influenza B by PCR: NEGATIVE
SARS Coronavirus 2 by RT PCR: NEGATIVE

## 2021-12-23 LAB — GROUP A STREP BY PCR: Group A Strep by PCR: DETECTED — AB

## 2021-12-23 MED ORDER — AMOXICILLIN 875 MG PO TABS: 875.0000 mg | ORAL_TABLET | Freq: Two times a day (BID) | ORAL | 0 refills | Status: DC

## 2021-12-23 NOTE — ED Notes (Signed)
Pt. Refused discharge VS. ?

## 2021-12-23 NOTE — Discharge Instructions (Signed)
Follow-up with your regular doctor as needed.  Return emergency department worsening.  Take antibiotics as prescribed.  Gargle salt water this is a natural antiseptic and will help with pain.  You may take Tylenol or ibuprofen for pain. ?

## 2021-12-23 NOTE — ED Provider Notes (Signed)
? ?[facility] ?Provider Note ? ? ? Event Date/Time  ? First MD Initiated Contact with Patient 12/23/21 1956   ?  (approximate) ? ? ?History  ? ?Sore Throat ? ? ?HPI ? ?Kathleen Tate is a 36 y.o. female complains of sore throat for 3 to 4 days.  Patient states she gargle with warm salt water which helps somewhat but now she is hurting on the left side of the throat and into the ear.  No fever or chills, no cough or congestion ? ?  ? ? ?Physical Exam  ? ?Triage Vital Signs: ?ED Triage Vitals  ?Enc Vitals Group  ?   BP 12/23/21 1930 (!) 132/94  ?   Pulse Rate 12/23/21 1930 95  ?   Resp 12/23/21 1930 17  ?   Temp 12/23/21 1930 98.5 ?F (36.9 ?C)  ?   Temp Source 12/23/21 1930 Oral  ?   SpO2 12/23/21 1930 96 %  ?   Weight 12/23/21 1927 145 lb (65.8 kg)  ?   Height 12/23/21 1927 4\' 11"  (1.499 m)  ?   Head Circumference --   ?   Peak Flow --   ?   Pain Score 12/23/21 1932 8  ?   Pain Loc --   ?   Pain Edu? --   ?   Excl. in [place]? --   ? ? ?Most recent vital signs: ?Vitals:  ? 12/23/21 1930  ?BP: (!) 132/94  ?Pulse: 95  ?Resp: 17  ?Temp: 98.5 ?F (36.9 ?C)  ?SpO2: 96%  ? ? ? ?General: Awake, no distress.   ?CV:  Good peripheral perfusion. regular rate and  rhythm ?Resp:  Normal effort. Lungs CTA ?Abd:  No distention.   ?Other:  Throat is red, left-sided neck is tender with swollen lymphadenopathy TMs are clear bilateral ? ? ?ED Results / Procedures / Treatments  ? ?Labs ?(all labs ordered are listed, but only abnormal results are displayed) ?Labs Reviewed  ?GROUP A STREP BY PCR - Abnormal; Notable for the following components:  ?    Result Value  ? Group A Strep by PCR DETECTED (*)   ? All other components within normal limits  ?RESP PANEL BY RT-PCR (FLU A&B, COVID) [id number]  ? ? ? ?EKG ? ? ? ? ?RADIOLOGY ? ? ? ? ?PROCEDURES: ? ? ?Procedures ? ? ?MEDICATIONS ORDERED IN ED: ?Medications - No data to display ? ? ?IMPRESSION / MDM / ASSESSMENT AND PLAN / ED COURSE  ?I reviewed the triage vital signs and the  nursing notes. ?             ?               ? ?Differential diagnosis includes, but is not limited to, acute pharyngitis, strep throat, mono, peritonsillar abscess ? ?I feel peritonsillar abscess is less likely as there is no gross amount of swelling, patient has clear voice and is able to drink fluids as she is eating potato chips and drinking a Coke while was in the exam room. ? ?Strep test is positive, respiratory panel is negative ? ?Explained the findings to the patient.  She will be placed on amoxicillin for strep throat.  She is to gargle warm salt water.  Take Tylenol or ibuprofen for pain as needed.  Return emergency department worsening.  Follow-up with your regular doctor if not improved in 3 to 4 days.  Patient is in agreement treatment plan.  She given  work note and discharged stable condition. ? ? ? ? ?  ? ? ?FINAL CLINICAL IMPRESSION(S) / ED DIAGNOSES  ? ?Final diagnoses:  ?Acute streptococcal pharyngitis  ? ? ? ?Rx / DC Orders  ? ?ED Discharge Orders   ? ?      Ordered  ?  amoxicillin (AMOXIL) 875 MG tablet  2 times daily,   Status:  Discontinued       ? 12/23/21 2008  ?  amoxicillin (AMOXIL) 875 MG tablet  2 times daily       ? 12/23/21 2008  ? ?  ?  ? ?  ? ? ? ?Note:  This document was prepared using Dragon voice recognition software and may include unintentional dictation errors. ? ?  ?Faythe Ghee, PA-C ?12/23/21 2106 ? ?  ?Shaune Pollack, MD ?12/28/21 1503 ? ?

## 2021-12-23 NOTE — ED Triage Notes (Signed)
Pt arrive with c/o sore throat x2 days. Pt denies fevers. Per pt, painful to swallow.  ?

## 2021-12-24 ENCOUNTER — Telehealth: Payer: Self-pay

## 2021-12-24 NOTE — Telephone Encounter (Signed)
Transition Care Management Unsuccessful Follow-up Telephone Call ? ?Date of discharge and from where:  12/23/2021 from [facility] ? ?Attempts:  1st Attempt ? ?Reason for unsuccessful TCM follow-up call:  Left voice message ? ? ? ?

## 2021-12-25 NOTE — Telephone Encounter (Signed)
Transition Care Management Unsuccessful Follow-up Telephone Call ? ?Date of discharge and from where:  12/23/2021-[facility] ? ?Attempts:  2nd Attempt ? ?Reason for unsuccessful TCM follow-up call:  Left voice message ? ?  ?

## 2021-12-28 NOTE — Telephone Encounter (Signed)
Transition Care Management Unsuccessful Follow-up Telephone Call ? ?Date of discharge and from where:  12/23/2021-[facility] ? ?Attempts:  3rd Attempt ? ?Reason for unsuccessful TCM follow-up call:  Unable to reach patient ? ? ? ?

## 2022-05-19 ENCOUNTER — Ambulatory Visit: Payer: Self-pay | Admitting: *Deleted

## 2022-05-19 NOTE — Telephone Encounter (Signed)
  Chief Complaint: breast lump Symptoms: R breast lump Frequency: 2 weeks Pertinent Negatives: Patient denies fever, rasg, nipple discharge Disposition: [] ED /[x] Urgent Care (no appt availability in office) / [] Appointment(In office/virtual)/ []  [facility]/ [] Home Care/ [] Refused Recommended Disposition /[] [facility]/ []  Follow-up with PCP Additional Notes: No PCP- advised UC for evaluation

## 2022-05-19 NOTE — Telephone Encounter (Signed)
Summary: Possible calcium deposit in right breast   Pt is experiencing what she believes is a calcium deposit in her right breast. There is no pain she just feels something abnormal in her breast.  Best contact: [phone] or T[place] [phone] (Mother in law)   Scheduled to establish with Webb Silversmith in April.      Reason for Disposition  Breast lump  Answer Assessment - Initial Assessment Questions 1. SYMPTOM: "What's the main symptom you're concerned about?"  (e.g., lump, pain, rash, nipple discharge)     lump 2. LOCATION: "Where is the lump located?"     Right breast- above nipple- 10 o'clock 3. ONSET: "When did lump  start?"     2 weeks 4. PRIOR HISTORY: "Do you have any history of prior problems with your breasts?" (e.g., lumps, cancer, fibrocystic breast disease)     no 5. CAUSE: "What do you think is causing this symptom?"     unsure 6. OTHER SYMPTOMS: "Do you have any other symptoms?" (e.g., fever, breast pain, redness or rash, nipple discharge)     no 7. PREGNANCY-BREASTFEEDING: "Is there any chance you are pregnant?" "When was your last menstrual period?" "Are you breastfeeding?"     No- BTL  Protocols used: Breast Symptoms-A-AH

## 2022-10-18 ENCOUNTER — Emergency Department
Admission: EM | Admit: 2022-10-18 | Discharge: 2022-10-19 | Disposition: A | Discharge status: Home / Self Care | Payer: Medicaid Other | Attending: Emergency Medicine | Admitting: Emergency Medicine

## 2022-10-18 ENCOUNTER — Other Ambulatory Visit: Payer: Self-pay

## 2022-10-18 ENCOUNTER — Encounter: Payer: Self-pay | Admitting: Emergency Medicine

## 2022-10-18 DIAGNOSIS — R059 Cough, unspecified: Secondary | ICD-10-CM | POA: Diagnosis present

## 2022-10-18 DIAGNOSIS — J101 Influenza due to other identified influenza virus with other respiratory manifestations: Secondary | ICD-10-CM | POA: Diagnosis not present

## 2022-10-18 DIAGNOSIS — Z20822 Contact with and (suspected) exposure to covid-19: Secondary | ICD-10-CM | POA: Diagnosis not present

## 2022-10-18 LAB — BASIC METABOLIC PANEL
Anion gap: 11 (ref 5–15)
BUN: 11 mg/dL (ref 6–20)
CO2: 21 mmol/L — ABNORMAL LOW (ref 22–32)
Calcium: 8.5 mg/dL — ABNORMAL LOW (ref 8.9–10.3)
Chloride: 98 mmol/L (ref 98–111)
Creatinine, Ser: 0.89 mg/dL (ref 0.44–1.00)
GFR, Estimated: >60 mL/min (ref >60)
Glucose, Bld: 92 mg/dL (ref 70–99)
Potassium: 3.6 mmol/L (ref 3.5–5.1)
Sodium: 130 mmol/L — ABNORMAL LOW (ref 135–145)

## 2022-10-18 LAB — CBC WITH DIFFERENTIAL/PLATELET
Abs Immature Granulocytes: 0.02 10*3/uL (ref 0.00–0.07)
Basophils Absolute: 0 10*3/uL (ref 0.0–0.1)
Basophils Relative: 0 %
Eosinophils Absolute: 0 10*3/uL (ref 0.0–0.5)
Eosinophils Relative: 0 %
HCT: 43.4 % (ref 36.0–46.0)
Hemoglobin: 14.3 g/dL (ref 12.0–15.0)
Immature Granulocytes: 0 %
Lymphocytes Relative: 6 %
Lymphs Abs: 0.4 10*3/uL — ABNORMAL LOW (ref 0.7–4.0)
MCH: 30.6 pg (ref 26.0–34.0)
MCHC: 32.9 g/dL (ref 30.0–36.0)
MCV: 92.7 fL (ref 80.0–100.0)
Monocytes Absolute: 0.6 10*3/uL (ref 0.1–1.0)
Monocytes Relative: 9 %
Neutro Abs: 5.5 10*3/uL (ref 1.7–7.7)
Neutrophils Relative %: 85 %
Platelets: 193 10*3/uL (ref 150–400)
RBC: 4.68 MIL/uL (ref 3.87–5.11)
RDW: 13.4 % (ref 11.5–15.5)
WBC: 6.5 10*3/uL (ref 4.0–10.5)
nRBC: 0 % (ref 0.0–0.2)

## 2022-10-18 LAB — RESP PANEL BY RT-PCR (RSV, FLU A&B, COVID)  RVPGX2
Influenza A by PCR: NEGATIVE
Influenza B by PCR: POSITIVE — AB
Resp Syncytial Virus by PCR: NEGATIVE
SARS Coronavirus 2 by RT PCR: NEGATIVE

## 2022-10-18 LAB — GROUP A STREP BY PCR: Group A Strep by PCR: NOT DETECTED

## 2022-10-18 MED ORDER — OSELTAMIVIR PHOSPHATE 75 MG PO CAPS: 75.0000 mg | ORAL_CAPSULE | Freq: Two times a day (BID) | ORAL | 0 refills | Status: AC

## 2022-10-18 MED ORDER — ACETAMINOPHEN-CODEINE 300-30 MG PO TABS
2.0000 | ORAL_TABLET | Freq: Once | ORAL | Status: AC
  Administered 2022-10-18: 2 via ORAL
  Filled 2022-10-18: qty 2

## 2022-10-18 MED ORDER — GUAIFENESIN ER 600 MG PO TB12
600.0000 mg | ORAL_TABLET | Freq: Once | ORAL | Status: AC
  Administered 2022-10-18: 600 mg via ORAL
  Filled 2022-10-18: qty 1

## 2022-10-18 MED ORDER — KETOROLAC TROMETHAMINE 30 MG/ML IJ SOLN
30.0000 mg | Freq: Once | INTRAMUSCULAR | Status: AC
  Administered 2022-10-18: 30 mg via INTRAVENOUS
  Filled 2022-10-18: qty 1

## 2022-10-18 MED ORDER — GUAIFENESIN 200 MG PO TABS: 400.0000 mg | ORAL_TABLET | Freq: Four times a day (QID) | ORAL | 0 refills | Status: DC | PRN

## 2022-10-18 MED ORDER — ACETAMINOPHEN-CODEINE 300-30 MG PO TABS: 1.0000 | ORAL_TABLET | Freq: Four times a day (QID) | ORAL | 0 refills | Status: AC | PRN

## 2022-10-18 MED ORDER — SODIUM CHLORIDE 0.9 % IV BOLUS
1000.0000 mL | Freq: Once | INTRAVENOUS | Status: AC
  Administered 2022-10-18: 1000 mL via INTRAVENOUS

## 2022-10-18 MED ORDER — IBUPROFEN 600 MG PO TABS: 600.0000 mg | ORAL_TABLET | Freq: Four times a day (QID) | ORAL | 0 refills | Status: AC | PRN

## 2022-10-18 MED ORDER — ACETAMINOPHEN 325 MG PO TABS
650.0000 mg | ORAL_TABLET | Freq: Once | ORAL | Status: AC | PRN
  Administered 2022-10-18: 650 mg via ORAL
  Filled 2022-10-18: qty 2

## 2022-10-18 NOTE — ED Provider Notes (Signed)
[facility] Provider Note    Event Date/Time   First MD Initiated Contact with Patient 10/18/22 2005     (approximate)   History   Cough   HPI  Kathleen Tate is a 37 y.o. female with no active medical problems who presents with sore throat, headache, cough, and generalized weakness and malaise since yesterday.  She denies any difficulty breathing.  She has no vomiting or diarrhea.  She has no specific sick contacts.  I reviewed the past medical records.  The patient's most recent outpatient encounter was a telephone counter with primary care for a breast lump in September of last year.  She was seen in the ED in May of last year for sore throat, and in February for cellulitis in her finger.  She has no other recent ED visits or admissions.   Physical Exam   Triage Vital Signs: ED Triage Vitals  Enc Vitals Group     BP 10/18/22 1801 123/84     Pulse Rate 10/18/22 1801 94     Resp 10/18/22 1801 18     Temp 10/18/22 1801 (!) 101.5 F (38.6 C)     Temp Source 10/18/22 1801 Oral     SpO2 10/18/22 1801 95 %     Weight --      Height --      Head Circumference --      Peak Flow --      Pain Score 10/18/22 1802 8     Pain Loc --      Pain Edu? --      Excl. in [place]? --     Most recent vital signs: Vitals:   10/18/22 1801 10/18/22 2232  BP: 123/84 95/82  Pulse: 94 84  Resp: 18 16  Temp: (!) 101.5 F (38.6 C)   SpO2: 95% 96%     General: Alert and oriented, uncomfortable appearing but in no acute distress. CV:  Good peripheral perfusion.  Resp:  Normal effort.  Lungs CTAB. Abd:  Soft and nontender.  No distention.  Other:  Oropharynx with slight erythema but no exudates.  Neck supple, full range of motion.  No meningeal signs.   ED Results / Procedures / Treatments   Labs (all labs ordered are listed, but only abnormal results are displayed) Labs Reviewed  RESP PANEL BY RT-PCR (RSV, FLU A&B, COVID)  RVPGX2 - Abnormal; Notable for the  following components:      Result Value   Influenza B by PCR POSITIVE (*)    All other components within normal limits  BASIC METABOLIC PANEL - Abnormal; Notable for the following components:   Sodium 130 (*)    CO2 21 (*)    Calcium 8.5 (*)    All other components within normal limits  CBC WITH DIFFERENTIAL/PLATELET - Abnormal; Notable for the following components:   Lymphs Abs 0.4 (*)    All other components within normal limits  GROUP A STREP BY PCR     EKG   RADIOLOGY   PROCEDURES:  Critical Care performed: No  Procedures   MEDICATIONS ORDERED IN ED: Medications  guaiFENesin (MUCINEX) 12 hr tablet 600 mg (has no administration in time range)  acetaminophen-codeine (TYLENOL #3) 300-30 MG per tablet 2 tablet (has no administration in time range)  acetaminophen (TYLENOL) tablet 650 mg (650 mg Oral Given 10/18/22 1803)  sodium chloride 0.9 % bolus 1,000 mL (1,000 mLs Intravenous New Bag/Given 10/18/22 2039)  ketorolac (TORADOL) 30 MG/ML  injection 30 mg (30 mg Intravenous Given 10/18/22 2042)     IMPRESSION / MDM / ASSESSMENT AND PLAN / ED COURSE  I reviewed the triage vital signs and the nursing notes.  37 year old female with PMH as noted above presents with fever sore throat, headache, cough, fatigue and malaise since yesterday.  On exam she is febrile with otherwise normal vital signs.  Physical exam is otherwise unremarkable for acute findings.  Differential diagnosis includes, but is not limited to, COVID-19, influenza, other viral syndrome, migraine, other acute headache syndrome.  Given the multisystemic symptoms and lack of meningeal signs there is no clinical evidence for meningitis.    Respiratory panel is positive for influenza B which is consistent with the patient's presentation.  BMP shows normal electrolytes and CBC shows no leukocytosis.  We will give a fluid bolus, treat symptomatically with Toradol and reassess.  Patient's presentation is most consistent  with acute complicated illness / injury requiring diagnostic workup.  ----------------------------------------- 11:20 PM on 10/18/2022 -----------------------------------------  The patient is feeling significantly better after fluids and Toradol.  She appears much more comfortable.  She is still coughing so I have ordered a dose of guaifenesin as well as Tylenol 3 to help with cough and pain.  However, the patient is stable for discharge home.  I counseled her on the results of the workup and plan of care.  Given that the symptoms started yesterday she is a good candidate for Tamiflu.  I have also prescribed other medication for symptomatic treatment.  I gave the patient strict return precautions and she expresses understanding.   FINAL CLINICAL IMPRESSION(S) / ED DIAGNOSES   Final diagnoses:  Influenza B     Rx / DC Orders   ED Discharge Orders          Ordered    oseltamivir (TAMIFLU) 75 MG capsule  2 times daily        10/18/22 2319    guaiFENesin 200 MG tablet  Every 6 hours PRN        10/18/22 2319    ibuprofen (ADVIL) 600 MG tablet  Every 6 hours PRN        10/18/22 2319    acetaminophen-codeine (TYLENOL #3) 300-30 MG tablet  Every 6 hours PRN        10/18/22 2319             Note:  This document was prepared using Dragon voice recognition software and may include unintentional dictation errors.    Arta Silence, MD 10/18/22 2321

## 2022-10-18 NOTE — Discharge Instructions (Signed)
Take the Tamiflu as prescribed and finish the full 5-day course.  You may take the ibuprofen as needed for body aches and fever as well as the Tylenol with codeine as needed for body aches and/or cough.  Take the guaifenesin for cough as well.  Make sure to drink plenty of fluids.  Follow-up with your primary care doctor.  Return to the ER for new, worsening, or persistent severe pain, high fevers, weakness, difficulty breathing, or any other new or worsening symptoms that concern you.

## 2022-10-18 NOTE — ED Triage Notes (Signed)
Patient to ED for sore throat, cough and headache x2 days. Patient moaning while sitting in wheelchair.

## 2022-10-18 NOTE — ED Notes (Signed)
No answer when called several times from lobby 

## 2022-10-19 NOTE — ED Notes (Signed)
E signature pad not working. Pt educated on discharge instructions and verbalized understanding.  

## 2022-10-21 ENCOUNTER — Emergency Department: Payer: Medicaid Other

## 2022-10-21 ENCOUNTER — Emergency Department
Admission: EM | Admit: 2022-10-21 | Discharge: 2022-10-22 | Disposition: A | Discharge status: Home / Self Care | Payer: Medicaid Other | Attending: Emergency Medicine | Admitting: Emergency Medicine

## 2022-10-21 DIAGNOSIS — J111 Influenza due to unidentified influenza virus with other respiratory manifestations: Secondary | ICD-10-CM | POA: Insufficient documentation

## 2022-10-21 DIAGNOSIS — R9431 Abnormal electrocardiogram [ECG] [EKG]: Secondary | ICD-10-CM | POA: Diagnosis not present

## 2022-10-21 DIAGNOSIS — J189 Pneumonia, unspecified organism: Secondary | ICD-10-CM | POA: Diagnosis not present

## 2022-10-21 DIAGNOSIS — J181 Lobar pneumonia, unspecified organism: Secondary | ICD-10-CM | POA: Insufficient documentation

## 2022-10-21 DIAGNOSIS — J101 Influenza due to other identified influenza virus with other respiratory manifestations: Secondary | ICD-10-CM | POA: Diagnosis not present

## 2022-10-21 DIAGNOSIS — R918 Other nonspecific abnormal finding of lung field: Secondary | ICD-10-CM | POA: Diagnosis not present

## 2022-10-21 DIAGNOSIS — I10 Essential (primary) hypertension: Secondary | ICD-10-CM | POA: Diagnosis not present

## 2022-10-21 DIAGNOSIS — R059 Cough, unspecified: Secondary | ICD-10-CM | POA: Diagnosis present

## 2022-10-21 MED ORDER — METOCLOPRAMIDE HCL 10 MG PO TABS
10.0000 mg | ORAL_TABLET | Freq: Once | ORAL | Status: AC
  Administered 2022-10-21: 10 mg via ORAL
  Filled 2022-10-21: qty 1

## 2022-10-21 MED ORDER — ONDANSETRON 4 MG PO TBDP: 4.0000 mg | ORAL_TABLET | Freq: Three times a day (TID) | ORAL | 0 refills | Status: DC | PRN

## 2022-10-21 MED ORDER — ALBUTEROL SULFATE HFA 108 (90 BASE) MCG/ACT IN AERS: 2.0000 | INHALATION_SPRAY | Freq: Four times a day (QID) | RESPIRATORY_TRACT | 2 refills | Status: DC | PRN

## 2022-10-21 MED ORDER — BENZONATATE 100 MG PO CAPS: ORAL_CAPSULE | ORAL | 0 refills | Status: DC

## 2022-10-21 MED ORDER — IPRATROPIUM-ALBUTEROL 0.5-2.5 (3) MG/3ML IN SOLN
3.0000 mL | Freq: Once | RESPIRATORY_TRACT | Status: AC
  Administered 2022-10-21: 3 mL via RESPIRATORY_TRACT
  Filled 2022-10-21: qty 3

## 2022-10-21 MED ORDER — AZITHROMYCIN 250 MG PO TABS: 250.0000 mg | ORAL_TABLET | Freq: Every day | ORAL | 0 refills | Status: DC

## 2022-10-21 MED ORDER — PREDNISONE 20 MG PO TABS
60.0000 mg | ORAL_TABLET | Freq: Once | ORAL | Status: AC
  Administered 2022-10-21: 60 mg via ORAL
  Filled 2022-10-21: qty 3

## 2022-10-21 MED ORDER — PREDNISONE 20 MG PO TABS: 40.0000 mg | ORAL_TABLET | Freq: Every day | ORAL | 0 refills | Status: DC

## 2022-10-21 MED ORDER — BENZONATATE 100 MG PO CAPS
200.0000 mg | ORAL_CAPSULE | Freq: Once | ORAL | Status: AC
  Administered 2022-10-21: 200 mg via ORAL
  Filled 2022-10-21: qty 2

## 2022-10-21 MED ORDER — KETOROLAC TROMETHAMINE 30 MG/ML IJ SOLN
30.0000 mg | Freq: Once | INTRAMUSCULAR | Status: AC
  Administered 2022-10-21: 30 mg via INTRAMUSCULAR
  Filled 2022-10-21: qty 1

## 2022-10-21 MED ORDER — AZITHROMYCIN 500 MG PO TABS
500.0000 mg | ORAL_TABLET | Freq: Once | ORAL | Status: DC
  Filled 2022-10-21: qty 1

## 2022-10-21 NOTE — ED Provider Notes (Addendum)
[facility] Emergency Department Provider Note     Event Date/Time   First MD Initiated Contact with Patient 10/21/22 2200     (approximate)   History   Influenza   HPI  Kathleen Tate is a 37 y.o. female patient with a history of hepatitis C, thrombocytopenia, and hypertension, returns to the ED 2 days after influenza diagnosis.  She was apparently discharged at that time with prescriptions for Tamiflu, guaifenesin, and ibuprofen.  Patient reports that the cough medicine was not covered by her insurance.  She has been taking the ibuprofen and Tamiflu, but notes that she apparently left the Tamiflu prescription and a friend's car and did not complete the course.  She presents to the ED noting some ongoing body aches, cough, headache, and congestion.  Patient without history of asthma is requesting a DuoNeb treatment for her cough and congestion.  Physical Exam   Triage Vital Signs: ED Triage Vitals  Enc Vitals Group     BP 10/21/22 2146 115/75     Pulse Rate 10/21/22 2146 92     Resp 10/21/22 2146 20     Temp 10/21/22 2146 98.1 F (36.7 C)     Temp Source 10/21/22 2146 Oral     SpO2 10/21/22 2146 96 %     Weight 10/21/22 2146 135 lb (61.2 kg)     Height 10/21/22 2146 '4\' 11"'$  (1.499 m)     Head Circumference --      Peak Flow --      Pain Score 10/21/22 2145 8     Pain Loc --      Pain Edu? --      Excl. in [place]? --     Most recent vital signs: Vitals:   10/21/22 2146  BP: 115/75  Pulse: 92  Resp: 20  Temp: 98.1 F (36.7 C)  SpO2: 96%    General Awake, no distress. NAD HEENT NCAT. PERRL. EOMI. No rhinorrhea. Mucous membranes are moist.  CV:  Good peripheral perfusion.  RESP:  Normal effort.  ABD:  No distention.   ED Results / Procedures / Treatments   Labs (all labs ordered are listed, but only abnormal results are displayed) Labs Reviewed - No data to display   EKG  Vent. rate 83 BPM PR interval 114 ms QRS duration 88  ms QT/QTcB 344/404 ms P-R-T axes 37 60 -2 Normal sinus rhythm Possible Anterior infarct , age undetermined T wave abnormality, consider lateral ischemia Abnormal ECG When compared with ECG of 19-Mar-2016 14:54,  RADIOLOGY   DG Chest 2 View  Result Date: 10/21/2022 CLINICAL DATA:  Flu. EXAM: CHEST - 2 VIEW COMPARISON:  03/19/2016 FINDINGS: The cardiomediastinal contours are normal. Bronchial thickening. Ill-defined patchy opacity in the left lower lobe. Pulmonary vasculature is normal. No pleural effusion or pneumothorax. Scoliotic curvature of the spine. No acute osseous abnormalities are seen. IMPRESSION: Bronchial thickening with ill-defined patchy opacity in the left lower lobe, suspicious for pneumonia. Electronically Signed   By: Keith Rake M.D.   On: 10/21/2022 23:47     PROCEDURES:  Critical Care performed: No  Procedures   MEDICATIONS ORDERED IN ED: Medications  azithromycin (ZITHROMAX) tablet 500 mg (has no administration in time range)  metoCLOPramide (REGLAN) tablet 10 mg (10 mg Oral Given 10/21/22 2306)  ipratropium-albuterol (DUONEB) 0.5-2.5 (3) MG/3ML nebulizer solution 3 mL (3 mLs Nebulization Given 10/21/22 2305)  benzonatate (TESSALON) capsule 200 mg (200 mg Oral Given 10/21/22 2306)  ketorolac (  TORADOL) 30 MG/ML injection 30 mg (30 mg Intramuscular Given 10/21/22 2305)  predniSONE (DELTASONE) tablet 60 mg (60 mg Oral Given 10/21/22 2306)     IMPRESSION / MDM / ASSESSMENT AND PLAN / ED COURSE  I reviewed the triage vital signs and the nursing notes.                              Differential diagnosis includes, but is not limited to, influenza  Patient's presentation is most consistent with acute complicated illness / injury requiring diagnostic workup.  Patient's diagnosis is consistent with flu and early CAP. Patient will be discharged home with prescriptions for azithromycin, Tessalon Perles, Zofran, prednisone, and albuterol inhaler. Patient is to  follow up with her primary provider or local community clinic, as needed or otherwise directed. Patient is given ED precautions to return to the ED for any worsening or new symptoms.   FINAL CLINICAL IMPRESSION(S) / ED DIAGNOSES   Final diagnoses:  Influenza  Community acquired pneumonia of left lower lobe of lung     Rx / DC Orders   ED Discharge Orders          Ordered    azithromycin (ZITHROMAX Z-PAK) 250 MG tablet  Daily        10/21/22 2355    benzonatate (TESSALON PERLES) 100 MG capsule        10/21/22 2329    albuterol (VENTOLIN HFA) 108 (90 Base) MCG/ACT inhaler  Every 6 hours PRN        10/21/22 2329    ondansetron (ZOFRAN-ODT) 4 MG disintegrating tablet  Every 8 hours PRN        10/21/22 2329    predniSONE (DELTASONE) 20 MG tablet  Daily with breakfast        10/21/22 2329             Note:  This document was prepared using Dragon voice recognition software and may include unintentional dictation errors.    Melvenia Needles, PA-C 10/21/22 2332    Blake Divine, MD 10/21/22 2351    Melvenia Needles, PA-C 10/21/22 2356    Blake Divine, MD 10/22/22 2241

## 2022-10-21 NOTE — Discharge Instructions (Addendum)
Take the prescription antibiotic and other meds as directed.  You may also take OTC Delsym for additional cough relief.  Follow-up with your primary provider or local community clinic for ongoing symptoms.  Be sure to rest and hydrate as necessary.  Take OTC ibuprofen for your fevers, headache, and body aches.

## 2022-10-21 NOTE — ED Triage Notes (Signed)
Pt reports being dx with flu 2 days ago and that her ins won't pay for some of the prescribed medications. Reports continued body aches, h/a, congestion. Reports sharp pain in chest as well. Pt requesting neb treatment but denies hx of asthma. Pt tearful in triage. Pt ambulatory and speaking in full sentences. Reports last dose of motrin was 1900.

## 2022-10-22 NOTE — ED Notes (Addendum)
Went into room to give patient her azithromycin and go over the discharge paperwork and found that the patient had left. She did receive discharge instructions and verified the pharmacy with the provider just prior to leaving. No discharge signature or vitals obtained prior to her departure.

## 2022-10-25 ENCOUNTER — Telehealth: Payer: Self-pay

## 2022-10-25 NOTE — Transitions of Care (Post Inpatient/ED Visit) (Signed)
   10/25/2022  Name: Kathleen Tate MRN: [id number] DOB: Jun 26, 1986  Today's TOC FU Call Status: Today's TOC FU Call Status:: Unsuccessul Call (1st Attempt) Unsuccessful Call (1st Attempt) Date: 10/25/22  Attempted to reach the patient regarding the most recent Inpatient/ED visit.  Follow Up Plan: Additional outreach attempts will be made to reach the patient to complete the Transitions of Care (Post Inpatient/ED visit) call.   Mickel Fuchs, BSW, [place] Managed Medicaid Team  [phone]

## 2022-10-26 ENCOUNTER — Encounter: Payer: Self-pay | Admitting: Internal Medicine

## 2022-10-26 ENCOUNTER — Ambulatory Visit (INDEPENDENT_AMBULATORY_CARE_PROVIDER_SITE_OTHER): Payer: Medicaid Other | Admitting: Internal Medicine

## 2022-10-26 VITALS — BP 118/76 | HR 69 | Temp 96.6°F | Ht 59.0 in | Wt 166.0 lb

## 2022-10-26 DIAGNOSIS — R7309 Other abnormal glucose: Secondary | ICD-10-CM

## 2022-10-26 DIAGNOSIS — F1910 Other psychoactive substance abuse, uncomplicated: Secondary | ICD-10-CM

## 2022-10-26 DIAGNOSIS — N6489 Other specified disorders of breast: Secondary | ICD-10-CM

## 2022-10-26 DIAGNOSIS — Z136 Encounter for screening for cardiovascular disorders: Secondary | ICD-10-CM

## 2022-10-26 DIAGNOSIS — Z8619 Personal history of other infectious and parasitic diseases: Secondary | ICD-10-CM

## 2022-10-26 DIAGNOSIS — F325 Major depressive disorder, single episode, in full remission: Secondary | ICD-10-CM | POA: Diagnosis not present

## 2022-10-26 DIAGNOSIS — R739 Hyperglycemia, unspecified: Secondary | ICD-10-CM

## 2022-10-26 DIAGNOSIS — Z6833 Body mass index (BMI) 33.0-33.9, adult: Secondary | ICD-10-CM

## 2022-10-26 DIAGNOSIS — B1710 Acute hepatitis C without hepatic coma: Secondary | ICD-10-CM

## 2022-10-26 DIAGNOSIS — E6609 Other obesity due to excess calories: Secondary | ICD-10-CM

## 2022-10-26 DIAGNOSIS — E66811 Other obesity due to excess calories: Secondary | ICD-10-CM

## 2022-10-26 DIAGNOSIS — Z113 Encounter for screening for infections with a predominantly sexual mode of transmission: Secondary | ICD-10-CM

## 2022-10-26 NOTE — Assessment & Plan Note (Signed)
Encouraged diet and exercise for weight loss ?

## 2022-10-26 NOTE — Assessment & Plan Note (Signed)
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

## 2022-10-26 NOTE — Assessment & Plan Note (Signed)
Currently not an issue Will monitor 

## 2022-10-26 NOTE — Assessment & Plan Note (Signed)
Will check Hep C antibody and Hep C RNA today She has not received treatment for this

## 2022-10-26 NOTE — Progress Notes (Signed)
HPI  Patient presents to clinic today to establish care and for management of the conditions listed below  Depression: Currently not an issue. She is not currently taking any medications for this. She is not currently seeing a therapist.  She denies anxiety, SI/HI.  History of Drug Abuse: Methamphetamine in the past. She smokes THC daily. She reports she is currently on probation.  History of Hep C: She reports she did not ever take any treatment for this.  She complains of a right breast mass. She noticed this about 9 months ago. She reports the area is not tender. She has not noticed any changes in the skin or discharge from the nipple. She has no family history of lung cancer.   Past Medical History:  Diagnosis Date   Depression    Drug abuse ([place])    Hepatitis C     Current Outpatient Medications  Medication Sig Dispense Refill   albuterol (VENTOLIN HFA) 108 (90 Base) MCG/ACT inhaler Inhale 2 puffs into the lungs every 6 (six) hours as needed for wheezing or shortness of breath. 8 g 2   azithromycin (ZITHROMAX Z-PAK) 250 MG tablet Take 1 tablet (250 mg total) by mouth daily for 4 days. 4 tablet 0   benzonatate (TESSALON PERLES) 100 MG capsule Take 1-2 tabs TID prn cough 30 capsule 0   citalopram (CELEXA) 20 MG tablet Take 20 mg by mouth daily.     guaiFENesin 200 MG tablet Take 2 tablets (400 mg total) by mouth every 6 (six) hours as needed for cough or to loosen phlegm. 30 suppository 0   ibuprofen (ADVIL) 600 MG tablet Take 1 tablet (600 mg total) by mouth every 6 (six) hours as needed. 30 tablet 0   ondansetron (ZOFRAN-ODT) 4 MG disintegrating tablet Take 1 tablet (4 mg total) by mouth every 8 (eight) hours as needed for nausea or vomiting. 12 tablet 0   predniSONE (DELTASONE) 20 MG tablet Take 2 tablets (40 mg total) by mouth daily with breakfast for 5 days. 10 tablet 0   No current facility-administered medications for this visit.    Allergies  Allergen Reactions    Cyclobenzaprine Nausea And Vomiting    Family History  Problem Relation Age of Onset   Lung cancer Mother    Lung cancer Father     Social History   Socioeconomic History   Marital status: Single    Spouse name: Not on file   Number of children: 2   Years of education: Not on file   Highest education level: Not on file  Occupational History   Not on file  Tobacco Use   Smoking status: Every Day    Packs/day: 0.50    Types: Cigarettes   Smokeless tobacco: Never  Vaping Use   Vaping Use: Never used  Substance and Sexual Activity   Alcohol use: No   Drug use: Yes    Types: Marijuana, Amphetamines, MDMA (Ecstacy)   Sexual activity: Not on file  Other Topics Concern   Not on file  Social History Narrative   Not on file   Social Determinants of Health   Financial Resource Strain: Not on file  Food Insecurity: Not on file  Transportation Needs: Not on file  Physical Activity: Not on file  Stress: Not on file  Social Connections: Not on file  Intimate Partner Violence: Not on file    ROS:  Constitutional: Denies fever, malaise, fatigue, headache or abrupt weight changes.  HEENT: Denies eye pain,  eye redness, ear pain, ringing in the ears, wax buildup, runny nose, nasal congestion, bloody nose, or sore throat. Respiratory: Denies difficulty breathing, shortness of breath, cough or sputum production.   Cardiovascular: Denies chest pain, chest tightness, palpitations or swelling in the hands or feet.  Gastrointestinal: Denies abdominal pain, bloating, constipation, diarrhea or blood in the stool.  GU: Denies frequency, urgency, pain with urination, blood in urine, odor or discharge. Musculoskeletal: Denies decrease in range of motion, difficulty with gait, muscle pain or joint pain and swelling.  Skin: Pt reports right breast mass. Denies redness, rashes, lesions or ulcercations.  Neurological: Denies dizziness, difficulty with memory, difficulty with speech or problems  with balance and coordination.  Psych: Patient has a history of depression.  Denies anxiety, SI/HI.  No other specific complaints in a complete review of systems (except as listed in HPI above).  PE:  BP 118/76 (BP Location: Left Arm, Patient Position: Sitting, Cuff Size: Normal)   Pulse 69   Temp (!) 96.6 F (35.9 C) (Temporal)   Ht '4\' 11"'$  (1.499 m)   Wt 166 lb (75.3 kg)   SpO2 99%   BMI 33.53 kg/m   Wt Readings from Last 3 Encounters:  10/21/22 135 lb (61.2 kg)  12/23/21 145 lb (65.8 kg)  11/14/20 140 lb (63.5 kg)    General: Appears her stated age, obese, in NAD. Skin: Breast symmetrical. She has some fullness of the right breast at 11 o'clock but no discrete mass noted. HEENT: Head: normal shape and size; Eyes: sclera white, no icterus, conjunctiva pink, PERRLA and EOMs intact;  Cardiovascular: Normal rate and rhythm. S1,S2 noted.  No murmur, rubs or gallops noted. No JVD or BLE edema. No carotid bruits noted. Pulmonary/Chest: Normal effort and coarse breath sounds with rhonchi throughout. No respiratory distress. No wheezes, rales noted.  Musculoskeletal:  No difficulty with gait.  Neurological: Alert and oriented. Cranial nerves II-XII grossly intact. Coordination normal.  Psychiatric: Mood and affect normal. Behavior is normal. Judgment and thought content normal.     BMET    Component Value Date/Time   NA 130 (L) 10/18/2022 2040   NA 137 05/15/2014 0434   K 3.6 10/18/2022 2040   K 3.3 (L) 05/15/2014 0434   CL 98 10/18/2022 2040   CL 102 05/15/2014 0434   CO2 21 (L) 10/18/2022 2040   CO2 29 05/15/2014 0434   GLUCOSE 92 10/18/2022 2040   GLUCOSE 64 (L) 05/15/2014 0434   BUN 11 10/18/2022 2040   BUN 5 (L) 05/15/2014 0434   CREATININE 0.89 10/18/2022 2040   CREATININE 0.73 04/05/2016 1116   CALCIUM 8.5 (L) 10/18/2022 2040   CALCIUM 7.6 (L) 05/15/2014 0434   GFRNONAA >60 10/18/2022 2040   GFRNONAA >89 04/05/2016 1116   GFRAA >60 10/04/2019 2212   GFRAA >89  04/05/2016 1116    Lipid Panel  No results found for: "CHOL", "TRIG", "HDL", "CHOLHDL", "VLDL", "LDLCALC"  CBC    Component Value Date/Time   WBC 6.5 10/18/2022 2040   RBC 4.68 10/18/2022 2040   HGB 14.3 10/18/2022 2040   HGB 12.0 05/13/2014 1415   HCT 43.4 10/18/2022 2040   HCT 35.3 05/15/2014 0434   PLT 193 10/18/2022 2040   PLT 183 05/13/2014 1415   MCV 92.7 10/18/2022 2040   MCV 90 05/13/2014 1415   MCH 30.6 10/18/2022 2040   MCHC 32.9 10/18/2022 2040   RDW 13.4 10/18/2022 2040   RDW 14.4 05/13/2014 1415   LYMPHSABS 0.4 (L) 10/18/2022  2040   LYMPHSABS 1.9 05/13/2014 1415   MONOABS 0.6 10/18/2022 2040   MONOABS 0.4 05/13/2014 1415   EOSABS 0.0 10/18/2022 2040   EOSABS 0.1 05/13/2014 1415   BASOSABS 0.0 10/18/2022 2040   BASOSABS 0.0 05/13/2014 1415    Hgb A1C No results found for: "HGBA1C"   Assessment and Plan:  Asymmetry of Right Breast:  Will obtain diagnostic mammogram and ultrasound of right breast  RTC in 6 months for your annual exam Webb Silversmith, NP

## 2022-10-26 NOTE — Patient Instructions (Signed)
Breast Self-Awareness Breast self-awareness means being familiar with how your breasts look and feel. It involves checking your breasts regularly and telling your health care provider about any changes. Practicing breast self-awareness helps to maintain breast health. Sometimes, changes are not harmful (are benign). Other times, a change in your breasts can be a sign of a serious medical problem. Being familiar with the look and feel of your breasts can help you catch a breast problem while it is still small and can be treated. You should do breast self-exams even if you have breast implants. What you need: A mirror. A well-lit room. A pillow or other soft object. How to do a breast self-exam A breast self-exam is one way to learn what is normal for your breasts and whether your breasts are changing. To do a breast self-exam: Look for changes  Remove all the clothing above your waist. Stand in front of a mirror in a room with good lighting. Put your hands down at your sides. Compare your breasts in the mirror. Look for differences between them (asymmetry), such as: Differences in shape. Differences in size. Puckers, dips, and bumps in one breast and not the other. Look at each breast for changes in the skin, such as: Redness. Scaly areas. Skin thickening. Dimpling. Open sores (ulcers). Look for changes in your nipples, such as: Discharge. Bleeding. Dimpling. Redness. A nipple that looks pushed in (retracted), or that has changed position. Feel for changes Carefully feel your breasts for lumps and changes. It is best to do this self-exam while lying down. Follow these steps to feel each breast: Place a pillow under the shoulder of one side of your body. Place the arm of that side of your body behind your head. Feel the breast of that side of your body using the hand of the opposite arm. To do this: Start in the nipple area and use the pads of your three middle fingers to make -inch  (2 cm) overlapping circles. Use light, medium, and then firm pressure as you feel your breast, gently covering the entire breast area and armpit. Continue the overlapping circles, moving downward over the breast until you feel your ribs below your breast. Then, make circles with your fingers going upward until you reach your collarbone. Next, make circles by moving outward across your breast and into your armpit area. Squeeze the nipple. Check for discharge and lumps. Repeat steps 1-7 to check your other breast. Sit or stand in the tub or shower. With soapy water on your skin, feel each breast the same way you did when you were lying down. Write down what you find Writing down what you find can help you remember what to discuss with your health care provider. Write down: What is normal for each breast. Any changes that you find in each breast. These include: The kind of changes you find. Any pain or tenderness. Size and location of any lumps. Where you are in your menstrual cycle, if you are still getting your menstrual period (menstruating). General tips If you are breastfeeding, the best time to examine your breasts is after a feeding or after using a breast pump. If you menstruate, the best time to examine your breasts is 5-7 days after your menstrual period. Breasts are generally lumpier during menstrual periods, and it may be more difficult to notice changes. With time and practice, you will become more familiar with the differences in your breasts and more comfortable with the exam. Contact a health care  provider if: You see a change in the shape or size of your breasts or nipples. You see a change in the skin of your breast or nipples, such as a reddened or scaly area. You have unusual discharge from your nipples. You find a new lump or thick area. You have breast pain. You have any concerns about your breast health. Summary Breast self-awareness includes looking for physical  changes in your breasts and feeling for any changes within your breasts. Breast self-awareness should be done in front of a mirror in a well-lit room. If you menstruate, the best time to examine your breasts is 5-7 days after your menstrual period. Tell your health care provider about any changes you notice in your breasts. Changes include changes in size, changes on the skin, pain or tenderness, or unusual fluid from your nipples. This information is not intended to replace advice given to you by your health care provider. Make sure you discuss any questions you have with your health care provider. Document Revised: 01/14/2022 Document Reviewed: 06/11/2021 Elsevier Patient Education  [place].

## 2022-10-31 LAB — HEPATITIS C ANTIBODY: Hepatitis C Ab: REACTIVE — AB

## 2022-10-31 LAB — CBC
HCT: 37 % (ref 35.0–45.0)
Hemoglobin: 12.5 g/dL (ref 11.7–15.5)
MCH: 30.3 pg (ref 27.0–33.0)
MCHC: 33.8 g/dL (ref 32.0–36.0)
MCV: 89.8 fL (ref 80.0–100.0)
MPV: 10.3 fL (ref 7.5–12.5)
Platelets: 350 10*3/uL (ref 140–400)
RBC: 4.12 10*6/uL (ref 3.80–5.10)
RDW: 13.2 % (ref 11.0–15.0)
WBC: 6.5 10*3/uL (ref 3.8–10.8)

## 2022-10-31 LAB — COMPLETE METABOLIC PANEL WITH GFR
AG Ratio: 1.2 (calc) (ref 1.0–2.5)
ALT: 15 U/L (ref 6–29)
AST: 17 U/L (ref 10–30)
Albumin: 3.5 g/dL — ABNORMAL LOW (ref 3.6–5.1)
Alkaline phosphatase (APISO): 60 U/L (ref 31–125)
BUN/Creatinine Ratio: 28 (calc) — ABNORMAL HIGH (ref 6–22)
BUN: 30 mg/dL — ABNORMAL HIGH (ref 7–25)
CO2: 28 mmol/L (ref 20–32)
Calcium: 8.6 mg/dL (ref 8.6–10.2)
Chloride: 104 mmol/L (ref 98–110)
Creat: 1.09 mg/dL — ABNORMAL HIGH (ref 0.50–0.97)
Globulin: 2.9 g/dL (calc) (ref 1.9–3.7)
Glucose, Bld: 104 mg/dL — ABNORMAL HIGH (ref 65–99)
Potassium: 3.9 mmol/L (ref 3.5–5.3)
Sodium: 142 mmol/L (ref 135–146)
Total Bilirubin: 0.3 mg/dL (ref 0.2–1.2)
Total Protein: 6.4 g/dL (ref 6.1–8.1)
eGFR: 68 mL/min/{1.73_m2} (ref >=60)

## 2022-10-31 LAB — LIPID PANEL
Cholesterol: 154 mg/dL (ref <200)
HDL: 29 mg/dL — ABNORMAL LOW (ref >=50)
LDL Cholesterol (Calc): 104 mg/dL (calc) — ABNORMAL HIGH
Non-HDL Cholesterol (Calc): 125 mg/dL (calc) (ref <130)
Total CHOL/HDL Ratio: 5.3 (calc) — ABNORMAL HIGH (ref <5.0)
Triglycerides: 118 mg/dL (ref <150)

## 2022-10-31 LAB — HEMOGLOBIN A1C
Hgb A1c MFr Bld: 5.5 % of total Hgb (ref <5.7)
Mean Plasma Glucose: 111 mg/dL
eAG (mmol/L): 6.2 mmol/L

## 2022-10-31 LAB — HCV RNA,QUANTITATIVE REAL TIME PCR
HCV Quantitative Log: 5.37 Log IU/mL — ABNORMAL HIGH
HCV RNA, PCR, QN: 232000 IU/mL — ABNORMAL HIGH

## 2022-10-31 LAB — HIV ANTIBODY (ROUTINE TESTING W REFLEX): HIV 1&2 Ab, 4th Generation: NONREACTIVE

## 2022-10-31 LAB — RPR: RPR Ser Ql: NONREACTIVE

## 2022-11-03 ENCOUNTER — Other Ambulatory Visit: Payer: Medicaid Other

## 2022-11-23 ENCOUNTER — Ambulatory Visit: Payer: Self-pay | Admitting: Internal Medicine

## 2022-12-09 ENCOUNTER — Encounter: Payer: Self-pay | Admitting: Internal Medicine

## 2022-12-12 IMAGING — US US PELVIS COMPLETE WITH TRANSVAGINAL
2 series · 13 of 25 positions shown · non-contrast
Comparison: None

CLINICAL DATA: Initial evaluation for acute right adnexal pain.



[Series 1: us pelvis (transabdominal only) · 12 of 53 slices shown]
[im 1/53]
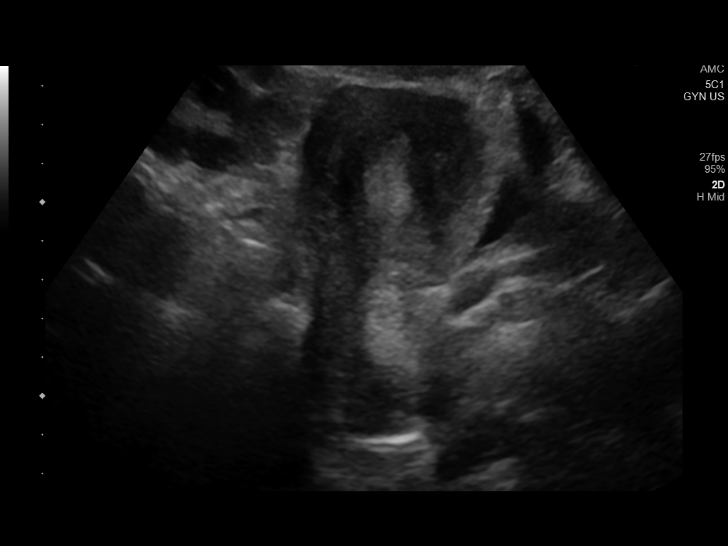
[im 5/53]
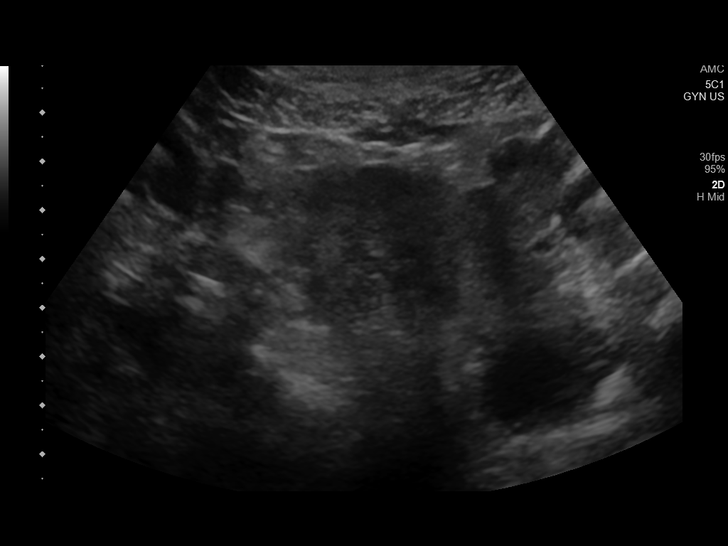
[im 10/53]
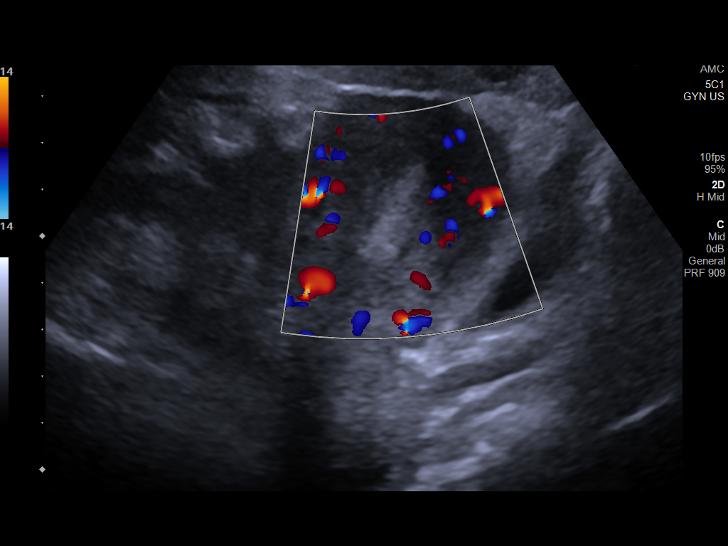
[im 14/53]
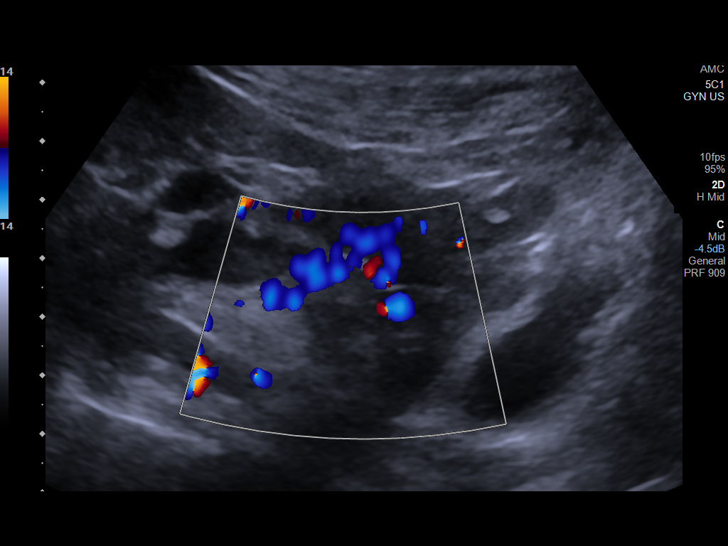
[im 19/53]
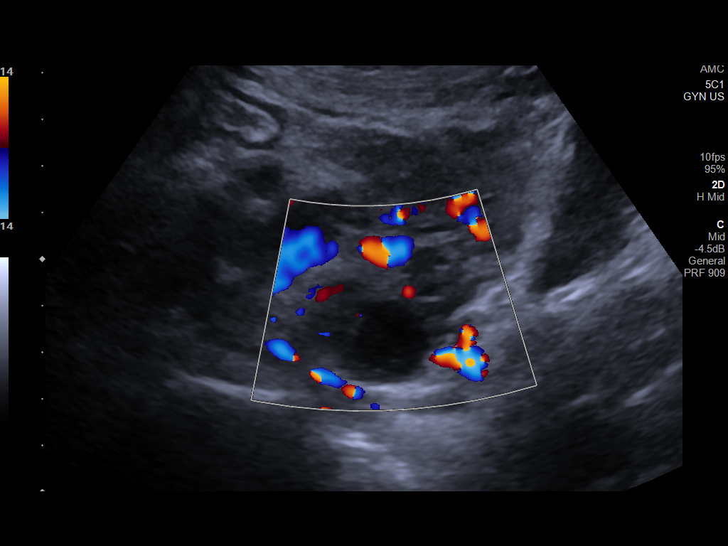
[im 23/53]
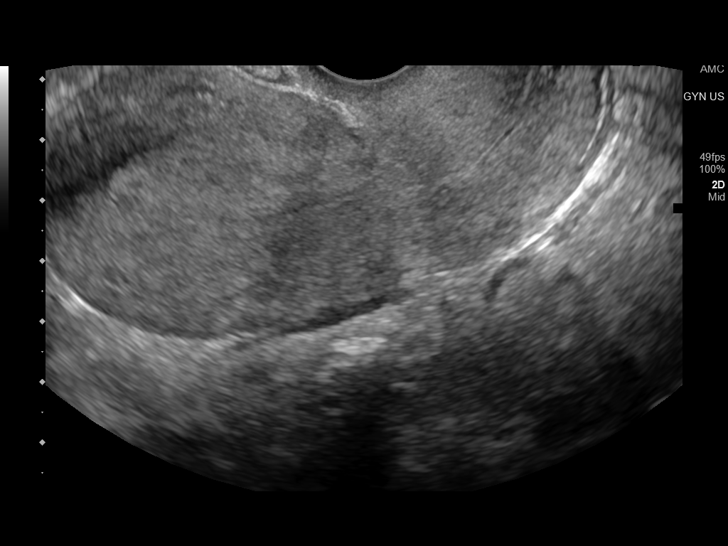
[im 28/53]
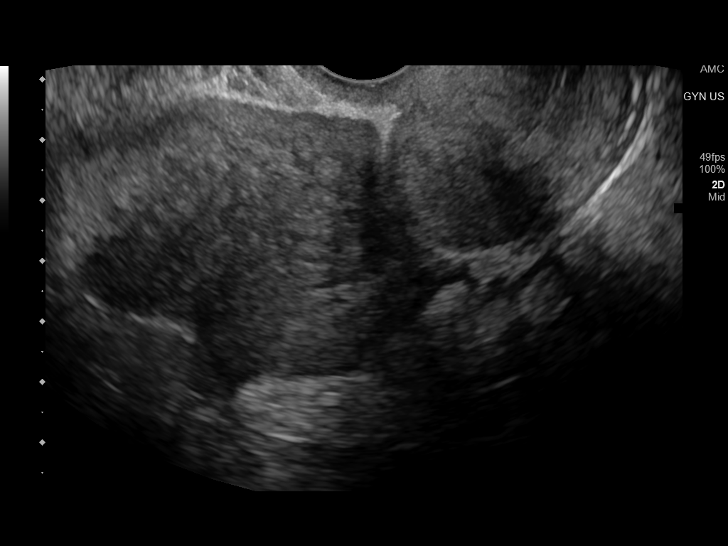
[im 32/53]
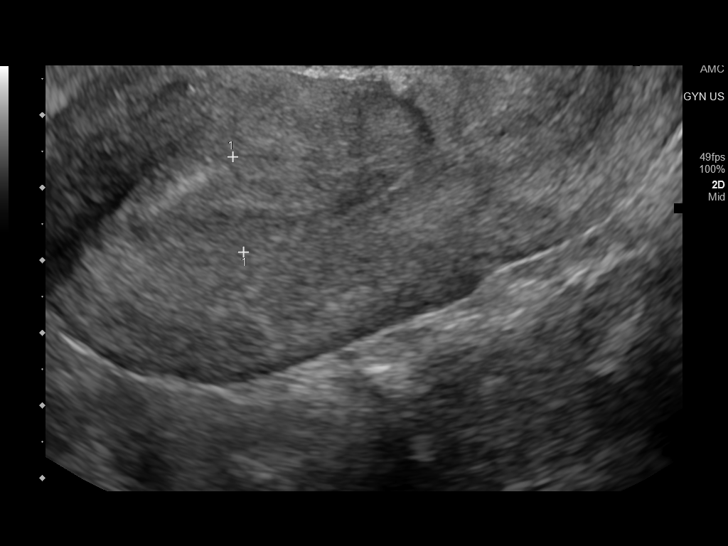
[im 37/53]
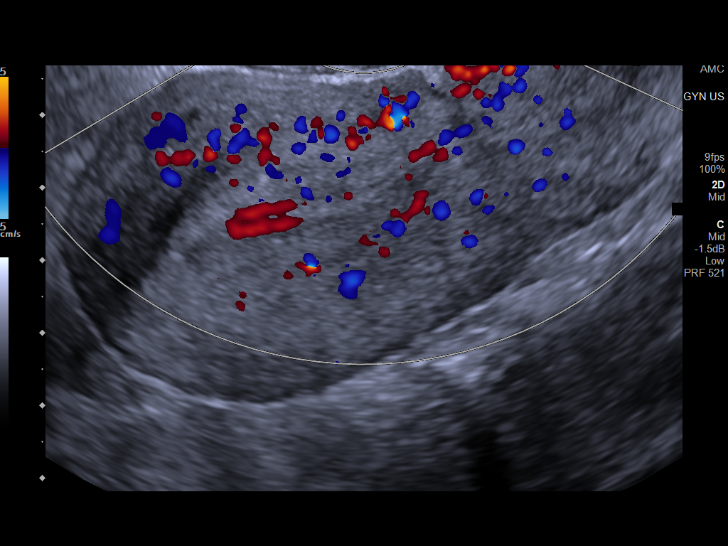
[im 41/53]
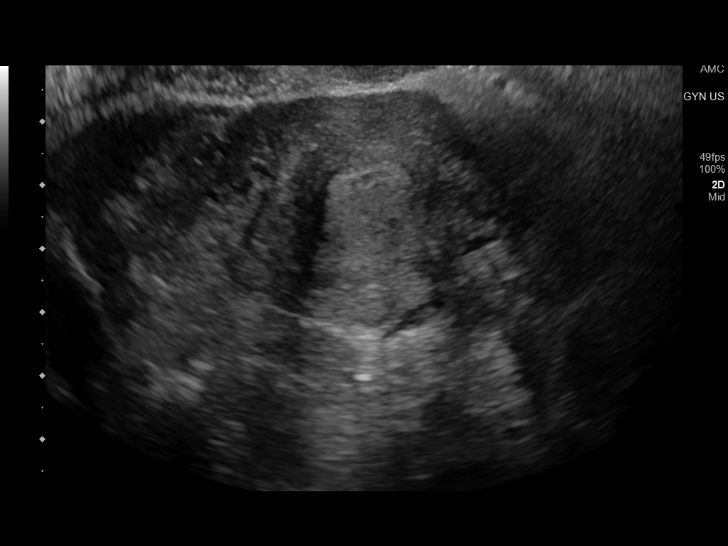
[im 46/53]
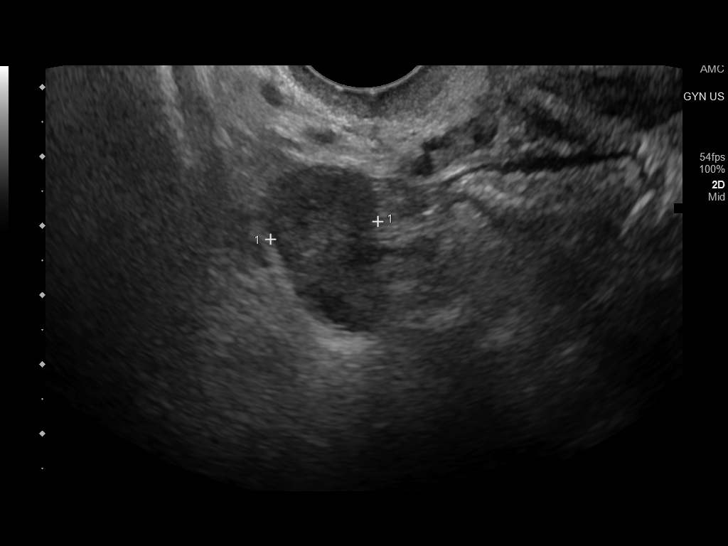
[im 50/53]
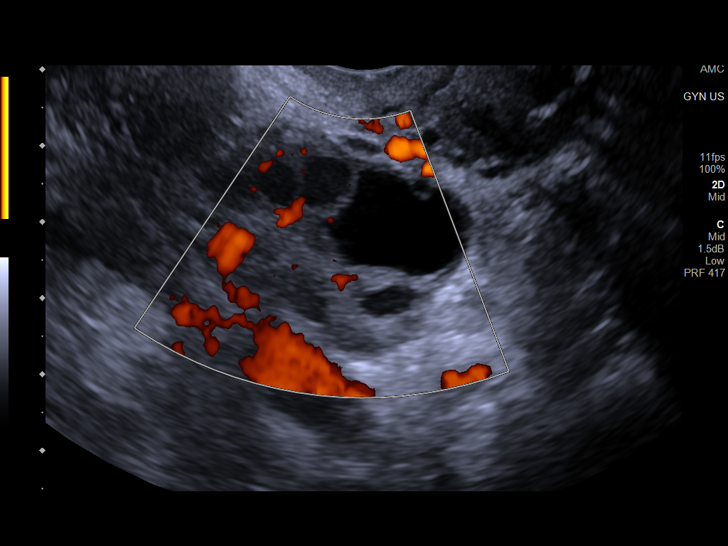

[Series 1001: gyn us · 1 of 1 slices shown]
[im 1/1]
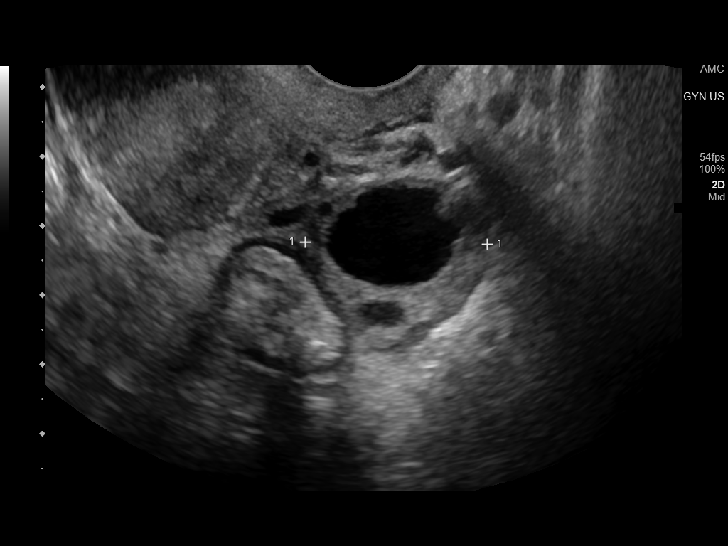

[13 of 25 positions shown; findings below may reference images not displayed]

FINDINGS: Uterus

Measurements: 9.4 x 4.4 x 5.0 cm = volume: 108 mL. No fibroids or
other mass visualized.

Endometrium

Thickness: 13.2 mm. Increased vascularity seen within the
endometrial complex, of uncertain significance. No visible focal
lesion.

Right ovary

Measurements: 3.1 x 2.2 x 1.6 cm = volume: 5.6 mL. Normal
appearance/no adnexal mass.

Left ovary

Measurements: 3.7 x 2.4 x 2.6 cm = volume: 13.3 mL. Normal
appearance/no adnexal mass. Small dominant follicle noted.

Other findings

No abnormal free fluid.
IMPRESSION: 1. Increased vascularity within the endometrial complex without
visible focal lesion, of uncertain significance. Correlation with
any potential symptomatology such as vaginal bleeding recommended.
Gynecologic referral with possible sonohysterography and potential
endometrial sampling could be performed as warranted.
2. Otherwise unremarkable and normal pelvic ultrasound. No other
findings to explain patient's symptoms identified.

## 2023-04-26 ENCOUNTER — Encounter: Payer: 59 | Admitting: Internal Medicine

## 2023-04-26 NOTE — Progress Notes (Deleted)
Subjective:    Patient ID: Kathleen Tate, female    DOB: 12/11/1985, 37 y.o.   MRN: [id number]  HPI  Patient presents to clinic today for her annual exam.  Flu: Tetanus: COVID: Pap smear: Dentist:  Diet: Exercise:  Review of Systems  Past Medical History:  Diagnosis Date   Depression    Drug abuse ([facility])    Hepatitis C     Current Outpatient Medications  Medication Sig Dispense Refill   ibuprofen (ADVIL) 600 MG tablet Take 1 tablet (600 mg total) by mouth every 6 (six) hours as needed. 30 tablet 0   No current facility-administered medications for this visit.    Allergies  Allergen Reactions   Cyclobenzaprine Nausea And Vomiting    Family History  Problem Relation Age of Onset   COPD Mother    Heart failure Father    Healthy Sister    Healthy Sister    Lung cancer Maternal Grandmother    Diabetes Paternal Grandmother    Leukemia Paternal Grandfather    Healthy Brother     Social History   Socioeconomic History   Marital status: Single    Spouse name: Not on file   Number of children: 2   Years of education: Not on file   Highest education level: Not on file  Occupational History   Not on file  Tobacco Use   Smoking status: Every Day    Current packs/day: 0.50    Types: Cigarettes   Smokeless tobacco: Never  Vaping Use   Vaping status: Never Used  Substance and Sexual Activity   Alcohol use: No   Drug use: Yes    Types: Marijuana, Amphetamines, MDMA (Ecstacy)   Sexual activity: Not on file  Other Topics Concern   Not on file  Social History Narrative   Not on file   Social Determinants of Health   Financial Resource Strain: Not on file  Food Insecurity: Not on file  Transportation Needs: Not on file  Physical Activity: Not on file  Stress: Not on file  Social Connections: Not on file  Intimate Partner Violence: Not on file     Constitutional: Denies fever, malaise, fatigue, headache or abrupt weight changes.  HEENT: Denies eye  pain, eye redness, ear pain, ringing in the ears, wax buildup, runny nose, nasal congestion, bloody nose, or sore throat. Respiratory: Denies difficulty breathing, shortness of breath, cough or sputum production.   Cardiovascular: Denies chest pain, chest tightness, palpitations or swelling in the hands or feet.  Gastrointestinal: Denies abdominal pain, bloating, constipation, diarrhea or blood in the stool.  GU: Denies urgency, frequency, pain with urination, burning sensation, blood in urine, odor or discharge. Musculoskeletal: Denies decrease in range of motion, difficulty with gait, muscle pain or joint pain and swelling.  Skin: Denies redness, rashes, lesions or ulcercations.  Neurological: Denies dizziness, difficulty with memory, difficulty with speech or problems with balance and coordination.  Psych: Patient has a history of depression.  Denies anxiety, SI/HI.  No other specific complaints in a complete review of systems (except as listed in HPI above).     Objective:   Physical Exam   There were no vitals taken for this visit. Wt Readings from Last 3 Encounters:  10/26/22 166 lb (75.3 kg)  10/21/22 135 lb (61.2 kg)  12/23/21 145 lb (65.8 kg)    General: Appears their stated age, well developed, well nourished in NAD. Skin: Warm, dry and intact. No rashes, lesions or ulcerations noted.  HEENT: Head: normal shape and size; Eyes: sclera white, no icterus, conjunctiva pink, PERRLA and EOMs intact; Ears: Tm's gray and intact, normal light reflex; Nose: mucosa pink and moist, septum midline; Throat/Mouth: Teeth present, mucosa pink and moist, no exudate, lesions or ulcerations noted.  Neck:  Neck supple, trachea midline. No masses, lumps or thyromegaly present.  Cardiovascular: Normal rate and rhythm. S1,S2 noted.  No murmur, rubs or gallops noted. No JVD or BLE edema. No carotid bruits noted. Pulmonary/Chest: Normal effort and positive vesicular breath sounds. No respiratory  distress. No wheezes, rales or ronchi noted.  Abdomen: Soft and nontender. Normal bowel sounds. No distention or masses noted. Liver, spleen and kidneys non palpable. Musculoskeletal: Normal range of motion. No signs of joint swelling. No difficulty with gait.  Neurological: Alert and oriented. Cranial nerves II-XII grossly intact. Coordination normal.  Psychiatric: Mood and affect normal. Behavior is normal. Judgment and thought content normal.    BMET    Component Value Date/Time   NA 142 10/26/2022 1535   NA 137 05/15/2014 0434   K 3.9 10/26/2022 1535   K 3.3 (L) 05/15/2014 0434   CL 104 10/26/2022 1535   CL 102 05/15/2014 0434   CO2 28 10/26/2022 1535   CO2 29 05/15/2014 0434   GLUCOSE 104 (H) 10/26/2022 1535   GLUCOSE 64 (L) 05/15/2014 0434   BUN 30 (H) 10/26/2022 1535   BUN 5 (L) 05/15/2014 0434   CREATININE 1.09 (H) 10/26/2022 1535   CALCIUM 8.6 10/26/2022 1535   CALCIUM 7.6 (L) 05/15/2014 0434   GFRNONAA >60 10/18/2022 2040   GFRNONAA >89 04/05/2016 1116   GFRAA >60 10/04/2019 2212   GFRAA >89 04/05/2016 1116    Lipid Panel     Component Value Date/Time   CHOL 154 10/26/2022 1535   TRIG 118 10/26/2022 1535   HDL 29 (L) 10/26/2022 1535   CHOLHDL 5.3 (H) 10/26/2022 1535   LDLCALC 104 (H) 10/26/2022 1535    CBC    Component Value Date/Time   WBC 6.5 10/26/2022 1535   RBC 4.12 10/26/2022 1535   HGB 12.5 10/26/2022 1535   HGB 12.0 05/13/2014 1415   HCT 37.0 10/26/2022 1535   HCT 35.3 05/15/2014 0434   PLT 350 10/26/2022 1535   PLT 183 05/13/2014 1415   MCV 89.8 10/26/2022 1535   MCV 90 05/13/2014 1415   MCH 30.3 10/26/2022 1535   MCHC 33.8 10/26/2022 1535   RDW 13.2 10/26/2022 1535   RDW 14.4 05/13/2014 1415   LYMPHSABS 0.4 (L) 10/18/2022 2040   LYMPHSABS 1.9 05/13/2014 1415   MONOABS 0.6 10/18/2022 2040   MONOABS 0.4 05/13/2014 1415   EOSABS 0.0 10/18/2022 2040   EOSABS 0.1 05/13/2014 1415   BASOSABS 0.0 10/18/2022 2040   BASOSABS 0.0 05/13/2014  1415    Hgb A1C Lab Results  Component Value Date   HGBA1C 5.5 10/26/2022           Assessment & Plan:   Preventative health maintenance:  Flu shot Tetanus Encouraged her to get her COVID-vaccine Pap smear Encouraged her to consume a balanced diet and exercise regimen Advised her to see an eye doctor and dentist annually We will check CBC, c-Met, lipid, A1c today   RTC in 6 months, follow-up chronic conditions Nicki Reaper, NP

## 2023-11-11 IMAGING — CR DG FINGER INDEX 2+V*R*
3 series · 3 of 3 positions shown · non-contrast
Comparison: None.

CLINICAL DATA: Index finger pain and swelling

EXAM:
RIGHT INDEX FINGER 2+V

[finger ap]
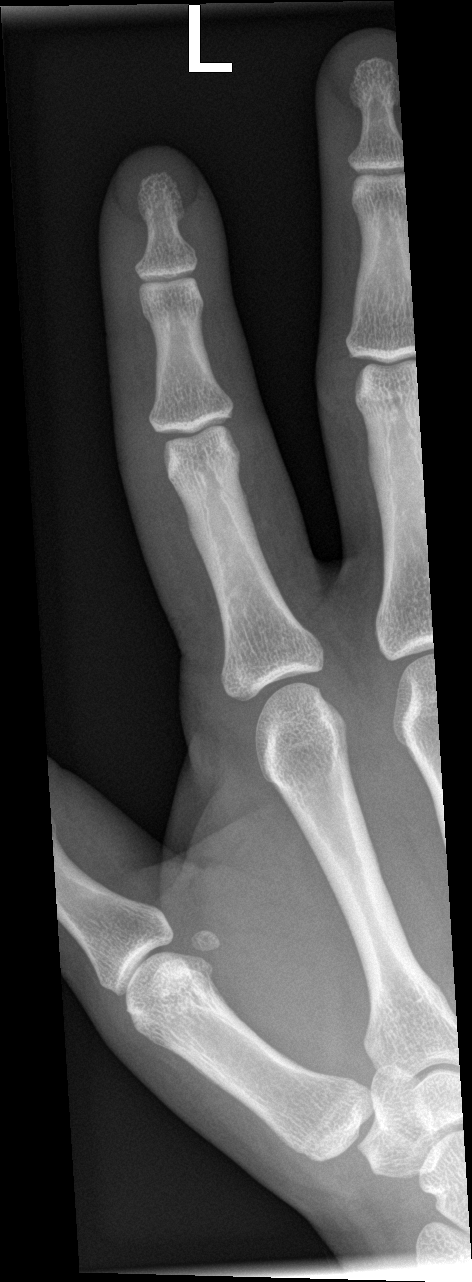

[finger obl]
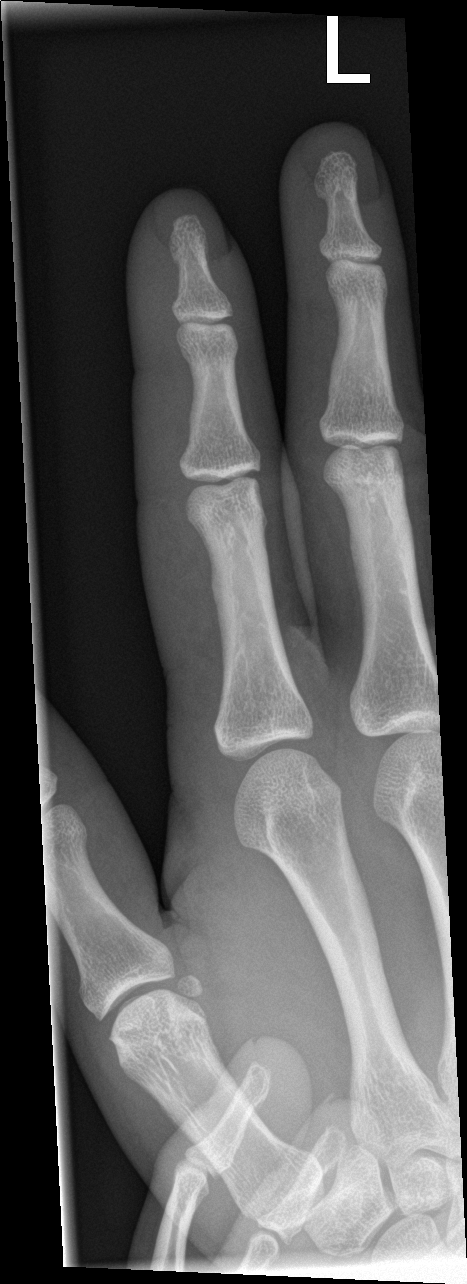

[finger lat]
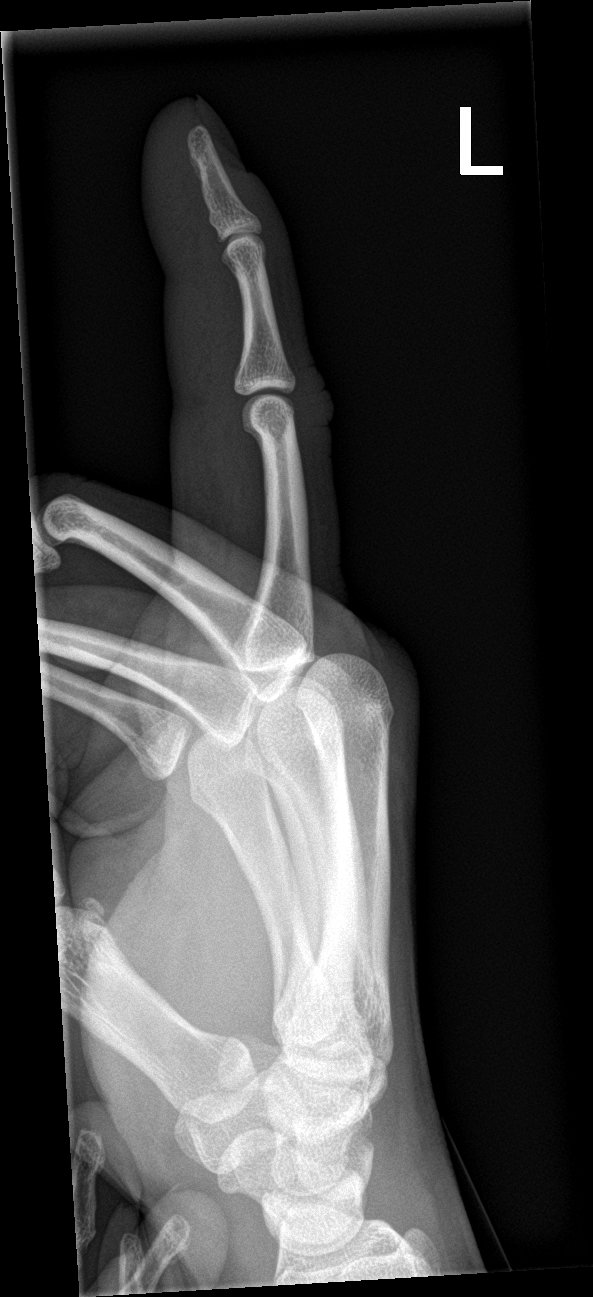

[3 of 3 positions shown; findings below may reference images not displayed]

FINDINGS: No fracture or malalignment. Diffuse soft tissue swelling without
radiopaque foreign body or emphysema
IMPRESSION: Soft tissue swelling without acute osseous abnormality
